# Patient Record
Sex: Female | Born: 1985
Health system: Southern US, Community
[De-identification: ages and names within clinical notes are randomized; demographics above are authoritative.]

## PROBLEM LIST (undated history)

## (undated) DIAGNOSIS — D649 Anemia, unspecified: Secondary | ICD-10-CM

## (undated) DIAGNOSIS — M810 Age-related osteoporosis without current pathological fracture: Secondary | ICD-10-CM

## (undated) DIAGNOSIS — T8484XA Pain due to internal orthopedic prosthetic devices, implants and grafts, initial encounter: Secondary | ICD-10-CM

## (undated) DIAGNOSIS — Z9889 Other specified postprocedural states: Secondary | ICD-10-CM

## (undated) DIAGNOSIS — R112 Nausea with vomiting, unspecified: Secondary | ICD-10-CM

## (undated) DIAGNOSIS — M069 Rheumatoid arthritis, unspecified: Secondary | ICD-10-CM

## (undated) DIAGNOSIS — Z8709 Personal history of other diseases of the respiratory system: Secondary | ICD-10-CM

## (undated) HISTORY — DX: Age-related osteoporosis without current pathological fracture: M81.0

## (undated) HISTORY — PX: WISDOM TOOTH EXTRACTION: SHX21

## (undated) HISTORY — PX: DILATION AND CURETTAGE OF UTERUS: SHX78

## (undated) HISTORY — DX: Rheumatoid arthritis, unspecified: M06.9

---

## 2005-08-03 ENCOUNTER — Encounter: Admission: RE | Admit: 2005-08-03 | Discharge: 2005-08-03 | Payer: Self-pay | Admitting: Emergency Medicine

## 2008-08-05 ENCOUNTER — Other Ambulatory Visit: Admission: RE | Admit: 2008-08-05 | Discharge: 2008-08-05 | Payer: Self-pay | Admitting: Obstetrics and Gynecology

## 2010-07-14 ENCOUNTER — Ambulatory Visit: Payer: Self-pay | Admitting: Internal Medicine

## 2010-07-14 DIAGNOSIS — J45909 Unspecified asthma, uncomplicated: Secondary | ICD-10-CM | POA: Insufficient documentation

## 2010-07-14 DIAGNOSIS — R030 Elevated blood-pressure reading, without diagnosis of hypertension: Secondary | ICD-10-CM | POA: Insufficient documentation

## 2010-07-14 DIAGNOSIS — M069 Rheumatoid arthritis, unspecified: Secondary | ICD-10-CM | POA: Insufficient documentation

## 2010-07-14 DIAGNOSIS — D509 Iron deficiency anemia, unspecified: Secondary | ICD-10-CM | POA: Insufficient documentation

## 2010-07-14 LAB — CONVERTED CEMR LAB
ALT: 14 units/L (ref 0–35)
AST: 17 units/L (ref 0–37)
Albumin: 3.7 g/dL (ref 3.5–5.2)
Alkaline Phosphatase: 79 units/L (ref 39–117)
BUN: 11 mg/dL (ref 6–23)
Basophils Absolute: 0.1 10*3/uL (ref 0.0–0.1)
Basophils Relative: 0.5 % (ref 0.0–3.0)
Bilirubin, Direct: 0 mg/dL (ref 0.0–0.3)
CO2: 28 meq/L (ref 19–32)
Calcium: 9.4 mg/dL (ref 8.4–10.5)
Chloride: 105 meq/L (ref 96–112)
Creatinine, Ser: 0.7 mg/dL (ref 0.4–1.2)
Eosinophils Absolute: 0.1 10*3/uL (ref 0.0–0.7)
Eosinophils Relative: 0.4 % (ref 0.0–5.0)
GFR calc non Af Amer: 140.83 mL/min (ref >60)
Glucose, Bld: 86 mg/dL (ref 70–99)
HCT: 33.5 % — ABNORMAL LOW (ref 36.0–46.0)
Hemoglobin: 11.3 g/dL — ABNORMAL LOW (ref 12.0–15.0)
Hgb A1c MFr Bld: 6.3 % (ref 4.6–6.5)
Lymphocytes Relative: 34.4 % (ref 12.0–46.0)
Lymphs Abs: 4.8 10*3/uL — ABNORMAL HIGH (ref 0.7–4.0)
MCHC: 33.6 g/dL (ref 30.0–36.0)
MCV: 85 fL (ref 78.0–100.0)
Monocytes Absolute: 0.9 10*3/uL (ref 0.1–1.0)
Monocytes Relative: 6.1 % (ref 3.0–12.0)
Neutro Abs: 8.2 10*3/uL — ABNORMAL HIGH (ref 1.4–7.7)
Neutrophils Relative %: 58.6 % (ref 43.0–77.0)
Platelets: 332 10*3/uL (ref 150.0–400.0)
Potassium: 3.5 meq/L (ref 3.5–5.1)
RBC: 3.94 M/uL (ref 3.87–5.11)
RDW: 14 % (ref 11.5–14.6)
Sed Rate: 39 mm/hr — ABNORMAL HIGH (ref 0–22)
Sodium: 139 meq/L (ref 135–145)
TSH: 0.52 microintl units/mL (ref 0.35–5.50)
Total Bilirubin: 0.5 mg/dL (ref 0.3–1.2)
Total Protein: 7.8 g/dL (ref 6.0–8.3)
WBC: 14 10*3/uL — ABNORMAL HIGH (ref 4.5–10.5)

## 2010-07-18 ENCOUNTER — Encounter: Admission: RE | Admit: 2010-07-18 | Discharge: 2010-07-18 | Payer: Self-pay | Admitting: Rheumatology

## 2010-08-05 ENCOUNTER — Emergency Department (HOSPITAL_COMMUNITY): Admission: EM | Admit: 2010-08-05 | Discharge: 2010-08-05 | Payer: Self-pay | Admitting: Emergency Medicine

## 2010-08-25 ENCOUNTER — Encounter: Payer: Self-pay | Admitting: Internal Medicine

## 2010-10-24 NOTE — Assessment & Plan Note (Signed)
Summary: New pt Cpx/UMR/cd   Vital Signs:  Patient profile:   25 year old female Menstrual status:  regular LMP:     07/03/2010 Height:      64 inches Weight:      159 pounds BMI:     27.39 O2 Sat:      95 % on Room air Temp:     98.3 degrees F oral Pulse rate:   85 / minute Pulse rhythm:   regular Resp:     16 per minute BP sitting:   132 / 90  (left arm)  Vitals Entered By: Jarome Lamas (July 14, 2010 11:15 AM)  Nutrition Counseling: Patient's BMI is greater than 25 and therefore counseled on weight management options.  O2 Flow:  Room air CC: labs LMP (date): 07/03/2010     Menstrual Status regular Enter LMP: 07/03/2010   Primary Care Nimah Uphoff:  Etta Grandchild MD  CC:  labs.  History of Present Illness: New to me she needs to have labs done. She has a note from an [place]. Dr. Hyacinth Meeker that asks that her BS be tested due to blurred vision and abrupt refractive changes on her exam. Also, she says that her Rheum, Dr. Valorie Roosevelt needs labs done for RA.  Preventive Screening-Counseling & Management  Alcohol-Tobacco     Alcohol drinks/day: 0     Alcohol Counseling: not indicated; patient does not drink     Smoking Status: never     Tobacco Counseling: not indicated; no tobacco use  Caffeine-Diet-Exercise     Does Patient Exercise: yes  Hep-HIV-STD-Contraception     Hepatitis Risk: no risk noted     HIV Risk: no risk noted     STD Risk: no risk noted      Sexual History:  currently monogamous.        Drug Use:  no.        Blood Transfusions:  no.    Clinical Review Panels:  Immunizations   Last Tetanus Booster:  Historical (06/28/2009)   Current Medications (verified): 1)  None  Allergies (verified): No Known Drug Allergies  Past History:  Past Medical History: Asthma Rheumatoid arthritis Anemia-NOS  Past Surgical History: Denies surgical history  Family History: Family History Diabetes 1st degree relative  Social History: Occupation:  [employer] Single Never Smoked Alcohol use-no Drug use-no Regular exercise-yes Smoking Status:  never Hepatitis Risk:  no risk noted HIV Risk:  no risk noted STD Risk:  no risk noted Sexual History:  currently monogamous Blood Transfusions:  no Drug Use:  no Does Patient Exercise:  yes  Review of Systems  The patient denies anorexia, fever, weight loss, weight gain, chest pain, syncope, dyspnea on exertion, peripheral edema, prolonged cough, headaches, hemoptysis, abdominal pain, suspicious skin lesions, difficulty walking, depression, and angioedema.   Resp:  Denies chest pain with inspiration, cough, coughing up blood, pleuritic, shortness of breath, sputum productive, and wheezing. MS:  Complains of joint pain and stiffness; denies joint redness, joint swelling, loss of strength, mid back pain, muscle aches, muscle weakness, and thoracic pain. Endo:  Denies cold intolerance, excessive hunger, excessive thirst, excessive urination, heat intolerance, polyuria, and weight change.  Physical Exam  General:  alert, well-developed, well-nourished, well-hydrated, appropriate dress, normal appearance, healthy-appearing, and cooperative to examination.   Head:  normocephalic, atraumatic, no abnormalities observed, and no abnormalities palpated.   Eyes:  No corneal or conjunctival inflammation noted. EOMI. Perrla. Funduscopic exam benign, without hemorrhages, exudates or papilledema. Vision grossly  normal. Mouth:  Oral mucosa and oropharynx without lesions or exudates.  Teeth in good repair. Neck:  supple, full ROM, no masses, no thyromegaly, no JVD, normal carotid upstroke, no carotid bruits, no cervical lymphadenopathy, and no neck tenderness.   Lungs:  normal respiratory effort, no intercostal retractions, no accessory muscle use, normal breath sounds, no dullness, no fremitus, no crackles, and no wheezes.   Heart:  normal rate, regular rhythm, no murmur, no gallop, no rub, and no JVD.     Abdomen:  soft, non-tender, normal bowel sounds, no distention, no masses, no guarding, no rigidity, no rebound tenderness, no abdominal hernia, no inguinal hernia, no hepatomegaly, and no splenomegaly.   Msk:  No deformity or scoliosis noted of thoracic or lumbar spine.   Pulses:  R and L carotid,radial,femoral,dorsalis pedis and posterior tibial pulses are full and equal bilaterally Extremities:  No clubbing, cyanosis, edema, or deformity noted with normal full range of motion of all joints.   Neurologic:  No cranial nerve deficits noted. Station and gait are normal. Plantar reflexes are down-going bilaterally. DTRs are symmetrical throughout. Sensory, motor and coordinative functions appear intact. Skin:  turgor normal, color normal, no rashes, no suspicious lesions, no ecchymoses, no petechiae, no purpura, no ulcerations, and no edema.   Cervical Nodes:  no anterior cervical adenopathy and no posterior cervical adenopathy.   Axillary Nodes:  no R axillary adenopathy and no L axillary adenopathy.   Inguinal Nodes:  no R inguinal adenopathy and no L inguinal adenopathy.   Psych:  Cognition and judgment appear intact. Alert and cooperative with normal attention span and concentration. No apparent delusions, illusions, hallucinations   Impression & Recommendations:  Problem # 1:  ELEVATED BLOOD PRESSURE WITHOUT DIAGNOSIS OF HYPERTENSION (ICD-796.2) Assessment New  Orders: Venipuncture (16109) TLB-BMP (Basic Metabolic Panel-BMET) (80048-METABOL) TLB-CBC Platelet - w/Differential (85025-CBCD) TLB-Hepatic/Liver Function Pnl (80076-HEPATIC) TLB-TSH (Thyroid Stimulating Hormone) (84443-TSH) TLB-A1C / Hgb A1C (Glycohemoglobin) (83036-A1C) TLB-Sedimentation Rate (ESR) (85652-ESR)  BP today: 132/90  Instructed in low sodium diet (DASH Handout) and behavior modification.    Problem # 2:  ENCOUNTER FOR LONG-TERM USE OF OTHER MEDICATIONS (ICD-V58.69) Assessment: New  Orders: Venipuncture  (60454) TLB-BMP (Basic Metabolic Panel-BMET) (80048-METABOL) TLB-CBC Platelet - w/Differential (85025-CBCD) TLB-Hepatic/Liver Function Pnl (80076-HEPATIC) TLB-TSH (Thyroid Stimulating Hormone) (84443-TSH) TLB-A1C / Hgb A1C (Glycohemoglobin) (83036-A1C) TLB-Sedimentation Rate (ESR) (85652-ESR)  Problem # 3:  RHEUMATOID ARTHRITIS (ICD-714.0) Assessment: Unchanged  Orders: Venipuncture (09811) TLB-BMP (Basic Metabolic Panel-BMET) (80048-METABOL) TLB-CBC Platelet - w/Differential (85025-CBCD) TLB-Hepatic/Liver Function Pnl (80076-HEPATIC) TLB-TSH (Thyroid Stimulating Hormone) (84443-TSH) TLB-A1C / Hgb A1C (Glycohemoglobin) (83036-A1C) TLB-Sedimentation Rate (ESR) (85652-ESR)  Patient Instructions: 1)  Please schedule a follow-up appointment in 1 month. 2)  It is important that you exercise regularly at least 20 minutes 5 times a week. If you develop chest pain, have severe difficulty breathing, or feel very tired , stop exercising immediately and seek medical attention. 3)  You need to lose weight. Consider a lower calorie diet and regular exercise.  4)  Check your Blood Pressure regularly. If it is above 140/90: you should make an appointment.   Orders Added: 1)  Venipuncture [36415] 2)  TLB-BMP (Basic Metabolic Panel-BMET) [80048-METABOL] 3)  TLB-CBC Platelet - w/Differential [85025-CBCD] 4)  TLB-Hepatic/Liver Function Pnl [80076-HEPATIC] 5)  TLB-TSH (Thyroid Stimulating Hormone) [84443-TSH] 6)  TLB-A1C / Hgb A1C (Glycohemoglobin) [83036-A1C] 7)  TLB-Sedimentation Rate (ESR) [85652-ESR] 8)  New Patient Level III [91478]   Immunization History:  Tetanus/Td Immunization History:    Tetanus/Td:  historical (06/28/2009)  Immunization History:  Tetanus/Td Immunization History:    Tetanus/Td:  Historical (06/28/2009)

## 2010-10-24 NOTE — Letter (Signed)
Summary: Results Follow-up Letter  [facility]  [address] [place], [place] [address]   Phone: [phone]  Fax: [phone]    07/14/2010  [address] [place], [place]  [address]  Dear Ms. Caseres,   The following are the results of your recent test(s):  Test     Result     Blood sugars   slightly elevated, early diabetes CBC       mild anemia, slightly elevated WBC count Liver/kidney   normal Sed rate     slightly elevated   _________________________________________________________  Please call for an appointment soon _________________________________________________________ _________________________________________________________ _________________________________________________________  Sincerely,  Sanda Linger MD  [facility]

## 2010-11-29 LAB — HM PAP SMEAR: HM Pap smear: NORMAL

## 2010-12-28 ENCOUNTER — Telehealth: Payer: Self-pay

## 2010-12-28 NOTE — Telephone Encounter (Signed)
error 

## 2011-01-04 ENCOUNTER — Telehealth: Payer: Self-pay | Admitting: Internal Medicine

## 2011-01-04 MED ORDER — GLUCOSE BLOOD VI STRP: ORAL_STRIP | Status: AC

## 2011-01-04 NOTE — Telephone Encounter (Signed)
Pt requesting glucose testing strips for TRUEresult meter obtained through [facility] program. Pt states that letter was sent by Marylu Lund Hauser/[employer]. Pt informed that letter was not received from [facility] concerning this matter; she will contact & have new letter forwarded for Pt's chart. Rx sent to [facility]. Pt Informed.

## 2011-01-08 ENCOUNTER — Ambulatory Visit (INDEPENDENT_AMBULATORY_CARE_PROVIDER_SITE_OTHER): Payer: 59 | Admitting: Internal Medicine

## 2011-01-08 ENCOUNTER — Encounter: Payer: 59 | Attending: Internal Medicine

## 2011-01-08 ENCOUNTER — Encounter: Payer: Self-pay | Admitting: Internal Medicine

## 2011-01-08 ENCOUNTER — Ambulatory Visit (INDEPENDENT_AMBULATORY_CARE_PROVIDER_SITE_OTHER)
Admission: RE | Admit: 2011-01-08 | Discharge: 2011-01-08 | Disposition: A | Discharge status: Home / Self Care | Payer: 59 | Source: Ambulatory Visit | Attending: Internal Medicine | Admitting: Internal Medicine

## 2011-01-08 ENCOUNTER — Telehealth: Payer: Self-pay | Admitting: Internal Medicine

## 2011-01-08 ENCOUNTER — Ambulatory Visit: Payer: 59 | Attending: Rheumatology | Admitting: Physical Therapy

## 2011-01-08 DIAGNOSIS — M25511 Pain in right shoulder: Secondary | ICD-10-CM | POA: Insufficient documentation

## 2011-01-08 DIAGNOSIS — M069 Rheumatoid arthritis, unspecified: Secondary | ICD-10-CM

## 2011-01-08 DIAGNOSIS — M25519 Pain in unspecified shoulder: Secondary | ICD-10-CM

## 2011-01-08 DIAGNOSIS — M2560 Stiffness of unspecified joint, not elsewhere classified: Secondary | ICD-10-CM | POA: Insufficient documentation

## 2011-01-08 DIAGNOSIS — Z5189 Encounter for other specified aftercare: Secondary | ICD-10-CM | POA: Insufficient documentation

## 2011-01-08 DIAGNOSIS — M2550 Pain in unspecified joint: Secondary | ICD-10-CM | POA: Insufficient documentation

## 2011-01-08 DIAGNOSIS — M6281 Muscle weakness (generalized): Secondary | ICD-10-CM | POA: Insufficient documentation

## 2011-01-08 MED ORDER — METHYLPREDNISOLONE ACETATE 80 MG/ML IJ SUSP
120.0000 mg | Freq: Once | INTRAMUSCULAR | Status: AC
  Administered 2011-01-08: 120 mg via INTRAMUSCULAR

## 2011-01-08 MED ORDER — TRUEPLUS LANCETS 28G MISC: 1.0000 g | Freq: Two times a day (BID) | Status: DC

## 2011-01-08 NOTE — Telephone Encounter (Signed)
Please call patient - normal shoulder xray results. Pain likely due to RA - No medication changes recommended. Keep followup with Deveshwar as ongoing for same -Thanks.

## 2011-01-08 NOTE — Telephone Encounter (Signed)
Called pt no ansew LMOM MD response concerning xray..01/08/11@5 :10pm/LMB

## 2011-01-08 NOTE — Progress Notes (Signed)
  Subjective:    Patient ID: Miranda [name], female    DOB: January 04, 1986, 25 y.o.   MRN: [id number]  HPI complains of right shoulder pain Onset 1 mo ago, progressively worse associated with swelling at top of right shoulder - tender to touch - not present on left shoulder Precipitated by overuse - industrial stapler use at work - but no fall or injury No hx same pain in this area but diffuse chronic jt pain/swelling from uncontrolled RA -  No weakness in right hand, no neck pain Pain worse with overhead or forward motion - better at rest but never resolved   Past Medical History  Diagnosis Date  . ANEMIA-NOS   . Rheumatoid arthritis   . ASTHMA     Review of Systems  Constitutional: Negative for fever, fatigue and unexpected weight change.  Respiratory: Negative for cough.   Musculoskeletal: Negative for myalgias and back pain.  Skin: Negative for rash.  Neurological: Negative for headaches.  Hematological: Negative for adenopathy.       Objective:   Physical Exam  Constitutional: She appears well-developed and well-nourished. No distress.  Cardiovascular: Normal rate, regular rhythm and normal heart sounds.   No murmur heard. Pulmonary/Chest: Effort normal and breath sounds normal. No respiratory distress.  Musculoskeletal:       Bursa swelling over ac right side, limited overhead ROM due to pain but RTC intact; chronic left wrist bursa pannus - no other synovitis of digits at this time   BP 130/72  Pulse 84  Temp(Src) 99.6 F (37.6 C) (Oral)  Ht 5\' 4"  (1.626 m)  Wt 144 lb (65.318 kg)  BMI 24.72 kg/m2  SpO2 99%      Lab Results  Component Value Date   WBC 14.0* 07/14/2010   HGB 11.3* 07/14/2010   HCT 33.5* 07/14/2010   PLT 332.0 07/14/2010   ALT 14 07/14/2010   AST 17 07/14/2010   NA 139 07/14/2010   K 3.5 07/14/2010   CL 105 07/14/2010   CREATININE 0.7 07/14/2010   BUN 11 07/14/2010   CO2 28 07/14/2010   TSH 0.52 07/14/2010   HGBA1C 6.3 07/14/2010      Assessment & Plan:  See problem list. Medications and labs reviewed today.

## 2011-01-08 NOTE — Assessment & Plan Note (Signed)
Hx reviewed - on chronic pred and follows with rheum for same (deveshwar) see shoulder pain above

## 2011-01-08 NOTE — Assessment & Plan Note (Signed)
suspect bursa inflammation from uncontrolled RA tx steroid burst with depo medrol now -  Check xray rule out other underlying bony abnormality - follow up rheum as ongoing to exhaust DMARDs for RA control - no change daily pred

## 2011-01-08 NOTE — Patient Instructions (Signed)
Test(s) ordered today. Your results will be called to you after review (48-72hours after test completion). If any changes need to be made, you will be notified at that time. Shot medrol given for swelling today - no change in prednisone dose follow up dr. Corliss Skains as planned for RA

## 2011-01-11 ENCOUNTER — Ambulatory Visit: Payer: Self-pay | Admitting: Occupational Therapy

## 2011-01-16 ENCOUNTER — Ambulatory Visit: Payer: 59 | Admitting: Physical Therapy

## 2011-01-18 ENCOUNTER — Ambulatory Visit: Payer: 59 | Admitting: Occupational Therapy

## 2011-01-19 ENCOUNTER — Ambulatory Visit: Payer: 59 | Admitting: Physical Therapy

## 2011-01-23 ENCOUNTER — Ambulatory Visit: Payer: 59 | Attending: Rheumatology | Admitting: Physical Therapy

## 2011-01-23 DIAGNOSIS — M2560 Stiffness of unspecified joint, not elsewhere classified: Secondary | ICD-10-CM | POA: Insufficient documentation

## 2011-01-23 DIAGNOSIS — M2550 Pain in unspecified joint: Secondary | ICD-10-CM | POA: Insufficient documentation

## 2011-01-23 DIAGNOSIS — M6281 Muscle weakness (generalized): Secondary | ICD-10-CM | POA: Insufficient documentation

## 2011-01-23 DIAGNOSIS — Z5189 Encounter for other specified aftercare: Secondary | ICD-10-CM | POA: Insufficient documentation

## 2011-01-26 ENCOUNTER — Ambulatory Visit: Payer: 59 | Admitting: Physical Therapy

## 2011-01-30 ENCOUNTER — Ambulatory Visit: Payer: 59 | Admitting: Occupational Therapy

## 2011-01-30 ENCOUNTER — Ambulatory Visit: Payer: 59 | Admitting: Physical Therapy

## 2011-02-02 ENCOUNTER — Ambulatory Visit: Payer: 59 | Admitting: Physical Therapy

## 2011-02-02 ENCOUNTER — Encounter: Payer: Self-pay | Admitting: Occupational Therapy

## 2011-02-05 ENCOUNTER — Ambulatory Visit: Payer: 59 | Admitting: Occupational Therapy

## 2011-02-06 ENCOUNTER — Encounter: Payer: Self-pay | Admitting: Physical Therapy

## 2011-02-08 ENCOUNTER — Ambulatory Visit: Payer: 59 | Admitting: Occupational Therapy

## 2011-02-09 ENCOUNTER — Encounter: Payer: Self-pay | Admitting: Physical Therapy

## 2011-02-09 ENCOUNTER — Ambulatory Visit: Payer: 59 | Admitting: Physical Therapy

## 2011-02-12 ENCOUNTER — Ambulatory Visit: Payer: 59 | Admitting: Occupational Therapy

## 2011-02-13 ENCOUNTER — Ambulatory Visit: Payer: 59 | Admitting: Physical Therapy

## 2011-02-14 ENCOUNTER — Ambulatory Visit: Payer: 59 | Admitting: Occupational Therapy

## 2011-02-15 ENCOUNTER — Ambulatory Visit: Payer: 59 | Admitting: Physical Therapy

## 2011-02-20 ENCOUNTER — Ambulatory Visit: Payer: 59 | Admitting: Occupational Therapy

## 2011-02-21 ENCOUNTER — Ambulatory Visit: Payer: 59 | Admitting: Physical Therapy

## 2011-02-22 ENCOUNTER — Ambulatory Visit: Payer: 59 | Admitting: Occupational Therapy

## 2011-02-27 ENCOUNTER — Ambulatory Visit: Payer: 59 | Attending: Rheumatology | Admitting: Physical Therapy

## 2011-02-27 ENCOUNTER — Ambulatory Visit: Payer: 59 | Admitting: Occupational Therapy

## 2011-02-27 DIAGNOSIS — M2550 Pain in unspecified joint: Secondary | ICD-10-CM | POA: Insufficient documentation

## 2011-02-27 DIAGNOSIS — Z5189 Encounter for other specified aftercare: Secondary | ICD-10-CM | POA: Insufficient documentation

## 2011-02-27 DIAGNOSIS — M2560 Stiffness of unspecified joint, not elsewhere classified: Secondary | ICD-10-CM | POA: Insufficient documentation

## 2011-02-27 DIAGNOSIS — M6281 Muscle weakness (generalized): Secondary | ICD-10-CM | POA: Insufficient documentation

## 2011-02-28 ENCOUNTER — Ambulatory Visit: Payer: 59 | Admitting: Physical Therapy

## 2011-03-01 ENCOUNTER — Encounter: Payer: Self-pay | Admitting: Occupational Therapy

## 2011-03-08 ENCOUNTER — Encounter: Payer: 59 | Attending: Internal Medicine

## 2011-03-13 ENCOUNTER — Ambulatory Visit: Payer: 59 | Admitting: Physical Therapy

## 2011-03-16 ENCOUNTER — Ambulatory Visit: Payer: 59 | Admitting: Physical Therapy

## 2011-03-19 ENCOUNTER — Ambulatory Visit: Payer: 59

## 2011-03-21 ENCOUNTER — Ambulatory Visit: Payer: 59 | Admitting: Physical Therapy

## 2011-03-23 ENCOUNTER — Ambulatory Visit: Payer: 59 | Admitting: Physical Therapy

## 2011-07-13 ENCOUNTER — Ambulatory Visit: Payer: Self-pay | Admitting: Internal Medicine

## 2011-07-27 ENCOUNTER — Other Ambulatory Visit (INDEPENDENT_AMBULATORY_CARE_PROVIDER_SITE_OTHER): Payer: 59

## 2011-07-27 ENCOUNTER — Ambulatory Visit (INDEPENDENT_AMBULATORY_CARE_PROVIDER_SITE_OTHER): Payer: 59 | Admitting: Internal Medicine

## 2011-07-27 ENCOUNTER — Encounter: Payer: Self-pay | Admitting: Internal Medicine

## 2011-07-27 VITALS — BP 132/86 | HR 90 | Temp 98.8°F | Resp 16 | Wt 138.5 lb

## 2011-07-27 DIAGNOSIS — R739 Hyperglycemia, unspecified: Secondary | ICD-10-CM | POA: Insufficient documentation

## 2011-07-27 DIAGNOSIS — R7309 Other abnormal glucose: Secondary | ICD-10-CM

## 2011-07-27 DIAGNOSIS — D649 Anemia, unspecified: Secondary | ICD-10-CM

## 2011-07-27 DIAGNOSIS — R030 Elevated blood-pressure reading, without diagnosis of hypertension: Secondary | ICD-10-CM

## 2011-07-27 LAB — CBC WITH DIFFERENTIAL/PLATELET
Basophils Absolute: 0 10*3/uL (ref 0.0–0.1)
Eosinophils Absolute: 0 10*3/uL (ref 0.0–0.7)
Eosinophils Relative: 0 % (ref 0.0–5.0)
Lymphs Abs: 1.6 10*3/uL (ref 0.7–4.0)
MCHC: 32.5 g/dL (ref 30.0–36.0)
MCV: 85.5 fl (ref 78.0–100.0)
Monocytes Absolute: 0.1 10*3/uL (ref 0.1–1.0)
Neutrophils Relative %: 78.7 % — ABNORMAL HIGH (ref 43.0–77.0)
Platelets: 408 10*3/uL — ABNORMAL HIGH (ref 150.0–400.0)
RDW: 15 % — ABNORMAL HIGH (ref 11.5–14.6)
WBC: 8.3 10*3/uL (ref 4.5–10.5)

## 2011-07-27 LAB — URINALYSIS, ROUTINE W REFLEX MICROSCOPIC
Bilirubin Urine: NEGATIVE
Ketones, ur: NEGATIVE
Nitrite: NEGATIVE
Specific Gravity, Urine: 1.015 (ref 1.000–1.030)
Total Protein, Urine: NEGATIVE
Urine Glucose: NEGATIVE
Urobilinogen, UA: 0.2 (ref 0.0–1.0)
pH: 7 (ref 5.0–8.0)

## 2011-07-27 LAB — LIPID PANEL
HDL: 60.5 mg/dL (ref >39.00)
LDL Cholesterol: 127 mg/dL — ABNORMAL HIGH (ref 0–99)
Total CHOL/HDL Ratio: 3
Triglycerides: 26 mg/dL (ref 0.0–149.0)
VLDL: 5.2 mg/dL (ref 0.0–40.0)

## 2011-07-27 LAB — COMPREHENSIVE METABOLIC PANEL
ALT: 13 U/L (ref 0–35)
AST: 17 U/L (ref 0–37)
Albumin: 4.1 g/dL (ref 3.5–5.2)
Alkaline Phosphatase: 80 U/L (ref 39–117)
BUN: 12 mg/dL (ref 6–23)
Calcium: 9.6 mg/dL (ref 8.4–10.5)
Creatinine, Ser: 0.7 mg/dL (ref 0.4–1.2)
Glucose, Bld: 127 mg/dL — ABNORMAL HIGH (ref 70–99)
Potassium: 4.1 mEq/L (ref 3.5–5.1)
Sodium: 136 mEq/L (ref 135–145)
Total Bilirubin: 0.5 mg/dL (ref 0.3–1.2)
Total Protein: 8.5 g/dL — ABNORMAL HIGH (ref 6.0–8.3)

## 2011-07-27 LAB — IBC PANEL
Iron: 28 ug/dL — ABNORMAL LOW (ref 42–145)
Saturation Ratios: 6.5 % — ABNORMAL LOW (ref 20.0–50.0)
Transferrin: 306.4 mg/dL (ref 212.0–360.0)

## 2011-07-27 LAB — TSH: TSH: 0.3 u[IU]/mL — ABNORMAL LOW (ref 0.35–5.50)

## 2011-07-27 LAB — FERRITIN: Ferritin: 34.9 ng/mL (ref 10.0–291.0)

## 2011-07-27 LAB — VITAMIN B12: Vitamin B-12: 588 pg/mL (ref 211–911)

## 2011-07-27 NOTE — Assessment & Plan Note (Signed)
I will recheck her a1c and see if she has developed DM that requires medical therapy

## 2011-07-27 NOTE — Progress Notes (Signed)
Subjective:    Patient ID: Miranda [name], female    DOB: 06-03-1986, 25 y.o.   MRN: [id number]  Anemia Presents for follow-up visit. Symptoms include malaise/fatigue. There has been no abdominal pain, anorexia, bruising/bleeding easily, confusion, fever, leg swelling, light-headedness, pallor, palpitations, paresthesias, pica or weight loss. Signs of blood loss that are present include menorrhagia. Signs of blood loss that are not present include hematemesis, hematochezia and melena. Compliance problems include psychosocial issues.  Compliance with medications is 0-25%.      Review of Systems  Constitutional: Positive for malaise/fatigue. Negative for fever, chills, weight loss, diaphoresis, activity change, appetite change, fatigue and unexpected weight change.  HENT: Negative.   Eyes: Negative.   Respiratory: Negative for apnea, cough, choking, chest tightness, shortness of breath, wheezing and stridor.   Cardiovascular: Negative for chest pain, palpitations and leg swelling.  Gastrointestinal: Negative for nausea, vomiting, abdominal pain, diarrhea, constipation, blood in stool, melena, hematochezia, abdominal distention, anal bleeding, anorexia and hematemesis.  Genitourinary: Positive for menorrhagia. Negative for dysuria, urgency, frequency, hematuria, vaginal discharge, enuresis, difficulty urinating, vaginal pain, menstrual problem, pelvic pain and dyspareunia.  Musculoskeletal: Negative for myalgias, back pain, joint swelling, arthralgias and gait problem.  Skin: Negative for color change, pallor, rash and wound.  Neurological: Negative for dizziness, tremors, seizures, syncope, facial asymmetry, speech difficulty, weakness, light-headedness, numbness, headaches and paresthesias.  Hematological: Negative for adenopathy. Does not bruise/bleed easily.  Psychiatric/Behavioral: Negative.  Negative for confusion.       Objective:   Physical Exam  Vitals reviewed. Constitutional:  She is oriented to person, place, and time. She appears well-developed and well-nourished. No distress.  HENT:  Head: Normocephalic and atraumatic. No trismus in the jaw.  Right Ear: Hearing, tympanic membrane, external ear and ear canal normal.  Left Ear: Hearing, tympanic membrane, external ear and ear canal normal.  Mouth/Throat: Oropharynx is clear and moist. Mucous membranes are pale, not dry and not cyanotic. No uvula swelling. No oropharyngeal exudate, posterior oropharyngeal edema, posterior oropharyngeal erythema or tonsillar abscesses.  Eyes: Conjunctivae are normal. Right eye exhibits no discharge. Left eye exhibits no discharge. No scleral icterus.  Neck: Normal range of motion. Neck supple. No JVD present. No tracheal deviation present. No thyromegaly present.  Cardiovascular: Normal rate, regular rhythm, normal heart sounds and intact distal pulses.  Exam reveals no gallop and no friction rub.   No murmur heard. Pulmonary/Chest: Effort normal and breath sounds normal. No stridor. No respiratory distress. She has no wheezes. She has no rales. She exhibits no tenderness.  Abdominal: Soft. Bowel sounds are normal. She exhibits no distension and no mass. There is no tenderness. There is no rebound and no guarding.  Musculoskeletal: Normal range of motion. She exhibits no edema and no tenderness.  Lymphadenopathy:    She has no cervical adenopathy.  Neurological: She is oriented to person, place, and time. She displays normal reflexes. She exhibits normal muscle tone.  Skin: Skin is warm and dry. No rash noted. She is not diaphoretic. No erythema. No pallor.  Psychiatric: She has a normal mood and affect. Her behavior is normal. Judgment and thought content normal.     Lab Results  Component Value Date   WBC 14.0* 07/14/2010   HGB 11.3* 07/14/2010   HCT 33.5* 07/14/2010   PLT 332.0 07/14/2010   GLUCOSE 86 07/14/2010   ALT 14 07/14/2010   AST 17 07/14/2010   NA 139 07/14/2010    K 3.5 07/14/2010   CL 105 07/14/2010  CREATININE 0.7 07/14/2010   BUN 11 07/14/2010   CO2 28 07/14/2010   TSH 0.52 07/14/2010   HGBA1C 6.3 07/14/2010       Assessment & Plan:

## 2011-07-27 NOTE — Assessment & Plan Note (Signed)
She remains borderline elevated but no meds are required at this time

## 2011-07-27 NOTE — Patient Instructions (Signed)
Anemia, Nonspecific Your exam and blood tests show you are anemic. This means your blood (hemoglobin) level is low. Normal hemoglobin values are 12 to 15 g/dL for females and 14 to 17 g/dL for males. Make a note of your hemoglobin level today. The hematocrit percent is also used to measure anemia. A normal hematocrit is 38% to 46% in females and 42% to 49% in males. Make a note of your hematocrit level today. CAUSES  Anemia can be due to many different causes.  Excessive bleeding from periods (in women).   Intestinal bleeding.   Poor nutrition.   Kidney, thyroid, liver, and bone marrow diseases.  SYMPTOMS  Anemia can come on suddenly (acute). It can also come on slowly. Symptoms can include:  Minor weakness.   Dizziness.   Palpitations.   Shortness of breath.  Symptoms may be absent until half your hemoglobin is missing if it comes on slowly. Anemia due to acute blood loss from an injury or internal bleeding may require blood transfusion if the loss is severe. Hospital care is needed if you are anemic and there is significant continual blood loss. TREATMENT   Stool tests for blood (Hemoccult) and additional lab tests are often needed. This determines the best treatment.   Further checking on your condition and your response to treatment is very important. It often takes many weeks to correct anemia.  Depending on the cause, treatment can include:  Supplements of iron.   Vitamins B12 and folic acid.   Hormone medicines.If your anemia is due to bleeding, finding the cause of the blood loss is very important. This will help avoid further problems.  SEEK IMMEDIATE MEDICAL CARE IF:   You develop fainting, extreme weakness, shortness of breath, or chest pain.   You develop heavy vaginal bleeding.   You develop bloody or black, tarry stools or vomit up blood.   You develop a high fever, rash, repeated vomiting, or dehydration.  Document Released: 10/18/2004 Document Revised:  05/23/2011 Document Reviewed: 07/26/2009 Exit[employer] Patient Information 2012 [employer]. 

## 2011-07-27 NOTE — Assessment & Plan Note (Signed)
I will check her CBC and will test her vitamin levels as well

## 2011-07-29 ENCOUNTER — Encounter: Payer: Self-pay | Admitting: Internal Medicine

## 2011-08-28 ENCOUNTER — Encounter: Payer: 59 | Attending: Internal Medicine | Admitting: *Deleted

## 2011-08-28 ENCOUNTER — Encounter: Payer: Self-pay | Admitting: *Deleted

## 2011-08-28 DIAGNOSIS — D649 Anemia, unspecified: Secondary | ICD-10-CM | POA: Insufficient documentation

## 2011-08-28 DIAGNOSIS — Z713 Dietary counseling and surveillance: Secondary | ICD-10-CM | POA: Insufficient documentation

## 2011-08-28 DIAGNOSIS — T380X5A Adverse effect of glucocorticoids and synthetic analogues, initial encounter: Secondary | ICD-10-CM | POA: Insufficient documentation

## 2011-08-28 DIAGNOSIS — E139 Other specified diabetes mellitus without complications: Secondary | ICD-10-CM | POA: Insufficient documentation

## 2011-08-28 DIAGNOSIS — R739 Hyperglycemia, unspecified: Secondary | ICD-10-CM

## 2011-08-28 DIAGNOSIS — R630 Anorexia: Secondary | ICD-10-CM | POA: Insufficient documentation

## 2011-08-28 NOTE — Patient Instructions (Signed)
Patient agrees to:  Increase food choices higher in iron including red meat,  baked beans and baked potato choices in place of rice for iron and vitamin C to increase absorption of iron in other foods Try Total and other enriched cereals that are high in iron Include vanilla Boost for increased iron and calories for weight management when too tired to cook  Consider cooking with a small iron skillet as additional source of iron in foods

## 2011-08-28 NOTE — Progress Notes (Signed)
  Medical Nutrition Therapy:  Appt start time: 0900 end time:  1000.   Assessment:  Primary concerns today: Patient states she is concerned about anemia which she states she has had since she was in college. She also states she can gain or lose 10 pounds quickly and would be more comfortable weighing about 10 more pounds @ 150. She has had some elevated BGs, but that is not her main concern today.   MEDICATIONS: see list   DIETARY INTAKE:  Usual eating pattern includes 3 meals and 2-3 snacks per day.  Everyday foods include limited variety of most food groups. She states she is a picky eater.  Avoided foods include milk, chocolate and ice cream, many vegetables and fruits.    24-hr recall:  B ( AM): Breakfast bowl from [employer]  Snk ( AM): occasionally vanilla wafers  L ( PM): grilled chicken and potato chips Snk ( PM): almonds D ( PM): chicken, mashed potatoes in microwave meals Snk ( PM): almonds Beverages: water  Usual physical activity: [employer] class 1x/week, stationary bike in fitness room 2 x/week  Estimated energy needs: 2000 calories 225 g carbohydrates 150 g protein 56 g fat  Progress Towards Goal(s):  In progress.   Nutritional Diagnosis:  NI-5.11.1 Predicted suboptimal nutrient intake As related to adequate iron and calorie intake.  As evidenced by anemia and desire to gain 10 pounds.    Intervention:  Nutrition counseling provided as to iron content of various foods compared to her current food choices, suggestion of using Boost supplement to increase calories appropriately and provide 25% iron per day, and possiblity of using an iron skillet for food preparation.. Patient agrees to: Increase food choices higher in iron including red meat,  baked beans and baked potato choices in place of rice for iron and vitamin C to increase absorption of iron in other foods Try Total and other enriched cereals that are high in iron Include vanilla Boost for increased iron and  calories for weight management when too tired to cook  Consider cooking with a small iron skillet as additional source of iron in foods Handouts given during visit include:  List of iron rich foods from [employer]  [employer] and Label Reading handouts  Monitoring/Evaluation:  Dietary intake, exercise, Label reading, and body weight in 4 week(s).

## 2011-10-19 ENCOUNTER — Ambulatory Visit (INDEPENDENT_AMBULATORY_CARE_PROVIDER_SITE_OTHER): Payer: 59

## 2011-10-19 DIAGNOSIS — Z23 Encounter for immunization: Secondary | ICD-10-CM

## 2011-10-23 ENCOUNTER — Other Ambulatory Visit (INDEPENDENT_AMBULATORY_CARE_PROVIDER_SITE_OTHER): Payer: 59

## 2011-10-23 ENCOUNTER — Encounter: Payer: Self-pay | Admitting: Internal Medicine

## 2011-10-23 ENCOUNTER — Ambulatory Visit (INDEPENDENT_AMBULATORY_CARE_PROVIDER_SITE_OTHER): Payer: 59 | Admitting: Internal Medicine

## 2011-10-23 VITALS — BP 120/80 | HR 104 | Temp 98.1°F | Resp 16 | Ht 64.0 in | Wt 135.0 lb

## 2011-10-23 DIAGNOSIS — R7309 Other abnormal glucose: Secondary | ICD-10-CM

## 2011-10-23 DIAGNOSIS — R739 Hyperglycemia, unspecified: Secondary | ICD-10-CM

## 2011-10-23 DIAGNOSIS — D649 Anemia, unspecified: Secondary | ICD-10-CM

## 2011-10-23 DIAGNOSIS — T380X5A Adverse effect of glucocorticoids and synthetic analogues, initial encounter: Secondary | ICD-10-CM

## 2011-10-23 DIAGNOSIS — M069 Rheumatoid arthritis, unspecified: Secondary | ICD-10-CM

## 2011-10-23 DIAGNOSIS — M8580 Other specified disorders of bone density and structure, unspecified site: Secondary | ICD-10-CM

## 2011-10-23 DIAGNOSIS — M839 Adult osteomalacia, unspecified: Secondary | ICD-10-CM

## 2011-10-23 DIAGNOSIS — M899 Disorder of bone, unspecified: Secondary | ICD-10-CM

## 2011-10-23 DIAGNOSIS — Z92241 Personal history of systemic steroid therapy: Secondary | ICD-10-CM

## 2011-10-23 LAB — VITAMIN B12: Vitamin B-12: 412 pg/mL (ref 211–911)

## 2011-10-23 LAB — URINALYSIS, ROUTINE W REFLEX MICROSCOPIC
Bilirubin Urine: NEGATIVE
Leukocytes, UA: NEGATIVE
Nitrite: NEGATIVE
pH: 7 (ref 5.0–8.0)

## 2011-10-23 LAB — IBC PANEL
Iron: 13 ug/dL — ABNORMAL LOW (ref 42–145)
Saturation Ratios: 3.6 % — ABNORMAL LOW (ref 20.0–50.0)
Transferrin: 257.8 mg/dL (ref 212.0–360.0)

## 2011-10-23 LAB — COMPREHENSIVE METABOLIC PANEL
BUN: 13 mg/dL (ref 6–23)
CO2: 27 mEq/L (ref 19–32)
Calcium: 9 mg/dL (ref 8.4–10.5)
Chloride: 103 mEq/L (ref 96–112)
Creatinine, Ser: 0.6 mg/dL (ref 0.4–1.2)
GFR: 150.12 mL/min (ref >60.00)
Glucose, Bld: 100 mg/dL — ABNORMAL HIGH (ref 70–99)

## 2011-10-23 LAB — CBC WITH DIFFERENTIAL/PLATELET
Basophils Absolute: 0 10*3/uL (ref 0.0–0.1)
Eosinophils Absolute: 0 10*3/uL (ref 0.0–0.7)
HCT: 27.9 % — ABNORMAL LOW (ref 36.0–46.0)
Hemoglobin: 9 g/dL — ABNORMAL LOW (ref 12.0–15.0)
Lymphocytes Relative: 32.4 % (ref 12.0–46.0)
Lymphs Abs: 2 10*3/uL (ref 0.7–4.0)
MCHC: 32.2 g/dL (ref 30.0–36.0)
Neutro Abs: 3.6 10*3/uL (ref 1.4–7.7)
RDW: 15.3 % — ABNORMAL HIGH (ref 11.5–14.6)

## 2011-10-23 LAB — FERRITIN: Ferritin: 70.7 ng/mL (ref 10.0–291.0)

## 2011-10-23 NOTE — Assessment & Plan Note (Signed)
ESR and CRP today to look for inflammatory process

## 2011-10-23 NOTE — Assessment & Plan Note (Signed)
I will check her CBC and will look at her vitamin levels as well 

## 2011-10-23 NOTE — Progress Notes (Signed)
Subjective:    Patient ID: Miranda [name], female    DOB: 01/25/1986, 26 y.o.   MRN: [id number]  Anemia Presents for follow-up visit. Symptoms include light-headedness and malaise/fatigue. There has been no anorexia, bruising/bleeding easily, confusion, fever, leg swelling, pallor, palpitations, paresthesias, pica or weight loss. Signs of blood loss that are present include menorrhagia. Signs of blood loss that are not present include hematemesis, hematochezia, melena and vaginal bleeding. Compliance problems include psychosocial issues.       Review of Systems  Constitutional: Positive for malaise/fatigue and fatigue. Negative for fever, chills, weight loss, diaphoresis, activity change, appetite change and unexpected weight change.  HENT: Negative.   Eyes: Negative.   Respiratory: Negative for cough, choking, chest tightness, shortness of breath, wheezing and stridor.   Cardiovascular: Negative for chest pain, palpitations and leg swelling.  Gastrointestinal: Negative for nausea, vomiting, diarrhea, constipation, blood in stool, melena, hematochezia, anal bleeding, anorexia and hematemesis.  Genitourinary: Positive for menorrhagia. Negative for dysuria, urgency, frequency, hematuria, flank pain, decreased urine volume, vaginal bleeding, enuresis, difficulty urinating, genital sores, pelvic pain and dyspareunia.  Musculoskeletal: Positive for arthralgias. Negative for myalgias, back pain, joint swelling and gait problem.  Skin: Negative for color change, pallor, rash and wound.  Neurological: Positive for dizziness and light-headedness. Negative for tremors, seizures, syncope, facial asymmetry, speech difficulty, weakness, numbness, headaches and paresthesias.  Hematological: Negative for adenopathy. Does not bruise/bleed easily.  Psychiatric/Behavioral: Negative.  Negative for confusion.       Objective:   Physical Exam  Vitals reviewed. Constitutional: She is oriented to person,  place, and time. She appears well-developed and well-nourished. No distress.  HENT:  Head: Normocephalic and atraumatic.  Mouth/Throat: Oropharynx is clear and moist. Mucous membranes are pale, not dry and not cyanotic. No oropharyngeal exudate, posterior oropharyngeal edema, posterior oropharyngeal erythema or tonsillar abscesses.  Eyes: Conjunctivae are normal. Right eye exhibits no discharge. Left eye exhibits no discharge. No scleral icterus.  Neck: Normal range of motion. Neck supple. No JVD present. No tracheal deviation present. No thyromegaly present.  Cardiovascular: Normal rate, regular rhythm, normal heart sounds and intact distal pulses.  Exam reveals no gallop and no friction rub.   No murmur heard. Pulmonary/Chest: Breath sounds normal. No stridor. No respiratory distress. She has no wheezes. She has no rales. She exhibits no tenderness.  Abdominal: Soft. Bowel sounds are normal. She exhibits no distension and no mass. There is no tenderness. There is no rebound and no guarding.  Musculoskeletal: Normal range of motion. She exhibits no edema and no tenderness.  Lymphadenopathy:    She has no cervical adenopathy.  Neurological: She is oriented to person, place, and time.  Skin: Skin is warm and dry. No rash noted. She is not diaphoretic. No erythema. No pallor.  Psychiatric: She has a normal mood and affect. Her behavior is normal. Judgment and thought content normal.     Lab Results  Component Value Date   WBC 8.3 07/27/2011   HGB 11.1* 07/27/2011   HCT 34.2* 07/27/2011   PLT 408.0* 07/27/2011   GLUCOSE 127* 07/27/2011   CHOL 193 07/27/2011   TRIG 26.0 07/27/2011   HDL 60.50 07/27/2011   LDLCALC 127* 07/27/2011   ALT 13 07/27/2011   AST 17 07/27/2011   NA 136 07/27/2011   K 4.1 07/27/2011   CL 101 07/27/2011   CREATININE 0.7 07/27/2011   BUN 12 07/27/2011   CO2 26 07/27/2011   TSH 0.30* 07/27/2011   HGBA1C 5.9 07/27/2011  Assessment & Plan:

## 2011-10-23 NOTE — Assessment & Plan Note (Signed)
She stopped prednisone one week ago and she fears that she has adrenal insufficiency so I will check her lytes and her cortisol level today

## 2011-10-23 NOTE — Patient Instructions (Signed)
Anemia, Nonspecific Your exam and blood tests show you are anemic. This means your blood (hemoglobin) level is low. Normal hemoglobin values are 12 to 15 g/dL for females and 14 to 17 g/dL for males. Make a note of your hemoglobin level today. The hematocrit percent is also used to measure anemia. A normal hematocrit is 38% to 46% in females and 42% to 49% in males. Make a note of your hematocrit level today. CAUSES  Anemia can be due to many different causes.  Excessive bleeding from periods (in women).   Intestinal bleeding.   Poor nutrition.   Kidney, thyroid, liver, and bone marrow diseases.  SYMPTOMS  Anemia can come on suddenly (acute). It can also come on slowly. Symptoms can include:  Minor weakness.   Dizziness.   Palpitations.   Shortness of breath.  Symptoms may be absent until half your hemoglobin is missing if it comes on slowly. Anemia due to acute blood loss from an injury or internal bleeding may require blood transfusion if the loss is severe. Hospital care is needed if you are anemic and there is significant continual blood loss. TREATMENT   Stool tests for blood (Hemoccult) and additional lab tests are often needed. This determines the best treatment.   Further checking on your condition and your response to treatment is very important. It often takes many weeks to correct anemia.  Depending on the cause, treatment can include:  Supplements of iron.   Vitamins B12 and folic acid.   Hormone medicines.If your anemia is due to bleeding, finding the cause of the blood loss is very important. This will help avoid further problems.  SEEK IMMEDIATE MEDICAL CARE IF:   You develop fainting, extreme weakness, shortness of breath, or chest pain.   You develop heavy vaginal bleeding.   You develop bloody or black, tarry stools or vomit up blood.   You develop a high fever, rash, repeated vomiting, or dehydration.  Document Released: 10/18/2004 Document Revised:  05/23/2011 Document Reviewed: 07/26/2009 Exit[employer] Patient Information 2012 [employer]. 

## 2011-10-23 NOTE — Assessment & Plan Note (Signed)
I will check her Vit D level today 

## 2011-10-23 NOTE — Assessment & Plan Note (Signed)
Vit D level today 

## 2011-10-23 NOTE — Assessment & Plan Note (Signed)
I will check her a1c today to see if she has developed DM II 

## 2011-11-30 ENCOUNTER — Emergency Department (HOSPITAL_COMMUNITY)
Admission: EM | Admit: 2011-11-30 | Discharge: 2011-11-30 | Disposition: A | Discharge status: Home / Self Care | Payer: 59 | Attending: Emergency Medicine | Admitting: Emergency Medicine

## 2011-11-30 ENCOUNTER — Other Ambulatory Visit: Payer: Self-pay

## 2011-11-30 ENCOUNTER — Encounter: Payer: Self-pay | Admitting: Internal Medicine

## 2011-11-30 ENCOUNTER — Other Ambulatory Visit (INDEPENDENT_AMBULATORY_CARE_PROVIDER_SITE_OTHER): Payer: 59

## 2011-11-30 ENCOUNTER — Ambulatory Visit (INDEPENDENT_AMBULATORY_CARE_PROVIDER_SITE_OTHER)
Admission: RE | Admit: 2011-11-30 | Discharge: 2011-11-30 | Disposition: A | Discharge status: Home / Self Care | Payer: 59 | Source: Ambulatory Visit | Attending: Internal Medicine | Admitting: Internal Medicine

## 2011-11-30 ENCOUNTER — Encounter (HOSPITAL_COMMUNITY): Payer: Self-pay

## 2011-11-30 ENCOUNTER — Ambulatory Visit (INDEPENDENT_AMBULATORY_CARE_PROVIDER_SITE_OTHER): Payer: 59 | Admitting: Internal Medicine

## 2011-11-30 ENCOUNTER — Emergency Department (HOSPITAL_COMMUNITY): Payer: 59

## 2011-11-30 DIAGNOSIS — R079 Chest pain, unspecified: Secondary | ICD-10-CM

## 2011-11-30 DIAGNOSIS — R791 Abnormal coagulation profile: Secondary | ICD-10-CM | POA: Insufficient documentation

## 2011-11-30 DIAGNOSIS — M069 Rheumatoid arthritis, unspecified: Secondary | ICD-10-CM | POA: Insufficient documentation

## 2011-11-30 DIAGNOSIS — R000 Tachycardia, unspecified: Secondary | ICD-10-CM

## 2011-11-30 DIAGNOSIS — D649 Anemia, unspecified: Secondary | ICD-10-CM

## 2011-11-30 DIAGNOSIS — R0602 Shortness of breath: Secondary | ICD-10-CM | POA: Insufficient documentation

## 2011-11-30 DIAGNOSIS — R0789 Other chest pain: Secondary | ICD-10-CM

## 2011-11-30 DIAGNOSIS — R071 Chest pain on breathing: Secondary | ICD-10-CM | POA: Insufficient documentation

## 2011-11-30 LAB — CBC WITH DIFFERENTIAL/PLATELET
Basophils Relative: 0.4 % (ref 0.0–3.0)
Eosinophils Relative: 1.2 % (ref 0.0–5.0)
HCT: 28.1 % — ABNORMAL LOW (ref 36.0–46.0)
Hemoglobin: 8.9 g/dL — ABNORMAL LOW (ref 12.0–15.0)
Lymphs Abs: 3 10*3/uL (ref 0.7–4.0)
MCV: 76.4 fl — ABNORMAL LOW (ref 78.0–100.0)
Monocytes Absolute: 0.7 10*3/uL (ref 0.1–1.0)
Monocytes Relative: 7.4 % (ref 3.0–12.0)
Neutro Abs: 6 10*3/uL (ref 1.4–7.7)
RBC: 3.68 Mil/uL — ABNORMAL LOW (ref 3.87–5.11)
WBC: 9.9 10*3/uL (ref 4.5–10.5)

## 2011-11-30 LAB — POCT I-STAT, CHEM 8
BUN: 16 mg/dL (ref 6–23)
Creatinine, Ser: 0.5 mg/dL (ref 0.50–1.10)
Hemoglobin: 9.5 g/dL — ABNORMAL LOW (ref 12.0–15.0)
Potassium: 3.6 mEq/L (ref 3.5–5.1)
Sodium: 140 mEq/L (ref 135–145)
TCO2: 25 mmol/L (ref 0–100)

## 2011-11-30 LAB — VITAMIN B12: Vitamin B-12: 480 pg/mL (ref 211–911)

## 2011-11-30 LAB — IBC PANEL
Iron: 22 ug/dL — ABNORMAL LOW (ref 42–145)
Saturation Ratios: 6 % — ABNORMAL LOW (ref 20.0–50.0)
Transferrin: 264 mg/dL (ref 212.0–360.0)

## 2011-11-30 LAB — FERRITIN: Ferritin: 79 ng/mL (ref 10.0–291.0)

## 2011-11-30 MED ORDER — ORPHENADRINE CITRATE ER 100 MG PO TB12: 100.0000 mg | ORAL_TABLET | Freq: Two times a day (BID) | ORAL | Status: AC

## 2011-11-30 MED ORDER — IOHEXOL 300 MG/ML  SOLN
100.0000 mL | Freq: Once | INTRAMUSCULAR | Status: AC | PRN
  Administered 2011-11-30: 100 mL via INTRAVENOUS

## 2011-11-30 MED ORDER — OXYCODONE-ACETAMINOPHEN 7.5-325 MG PO TABS: 1.0000 | ORAL_TABLET | ORAL | Status: AC | PRN

## 2011-11-30 NOTE — Discharge Instructions (Signed)
Chest Wall Pain Chest wall pain is pain in or around the bones and muscles of your chest. It may take up to 6 weeks to get better. It may take longer if you must stay physically active in your work and activities.  CAUSES  Chest wall pain may happen on its own. However, it may be caused by:  A viral illness like the flu.   Injury.   Coughing.   Exercise.   Arthritis.   Fibromyalgia.   Shingles.  HOME CARE INSTRUCTIONS   Avoid overtiring physical activity. Try not to strain or perform activities that cause pain. This includes any activities using your chest or your abdominal and side muscles, especially if heavy weights are used.   Put ice on the sore area.   Put ice in a plastic bag.   Place a towel between your skin and the bag.   Leave the ice on for 15 to 20 minutes per hour while awake for the first 2 days.   Only take over-the-counter or prescription medicines for pain, discomfort, or fever as directed by your caregiver.  SEEK IMMEDIATE MEDICAL CARE IF:   Your pain increases, or you are very uncomfortable.   You have a fever.   Your chest pain becomes worse.   You have new, unexplained symptoms.   You have nausea or vomiting.   You feel sweaty or lightheaded.   You have a cough with phlegm (sputum), or you cough up blood.  MAKE SURE YOU:   Understand these instructions.   Will watch your condition.   Will get help right away if you are not doing well or get worse.  Document Released: 09/10/2005 Document Revised: 08/30/2011 Document Reviewed: 05/07/2011 [facility] Patient Information 2012 [place], [place]. Number CT study shows that you do not have a PE.  I prescribed a muscle relaxer to enter your current medication regime.  Please follow up with Dr. Yetta Barre on Monday by phone

## 2011-11-30 NOTE — ED Notes (Signed)
Patient transported to CT 

## 2011-11-30 NOTE — Progress Notes (Signed)
Subjective:    Patient ID: Miranda [name], female    DOB: 1986/07/06, 26 y.o.   MRN: [id number]  Chest Pain  This is a recurrent problem. The current episode started 1 to 4 weeks ago. The onset quality is gradual. The problem occurs intermittently. The problem has been gradually worsening. The pain is present in the lateral region (right upper chest near the sternum). The pain is at a severity of 3/10. The pain is severe. The quality of the pain is described as stabbing. The pain does not radiate. Pertinent negatives include no abdominal pain, back pain, claudication, cough, diaphoresis, dizziness, exertional chest pressure, fever, headaches, hemoptysis, irregular heartbeat, leg pain, lower extremity edema, malaise/fatigue, nausea, near-syncope, numbness, orthopnea, palpitations, PND, shortness of breath, sputum production, syncope, vomiting or weakness. The pain is aggravated by breathing and movement. She has tried NSAIDs and analgesics for the symptoms. The treatment provided no relief.  Pertinent negatives for past medical history include no seizures.      Review of Systems  Constitutional: Negative for fever, chills, malaise/fatigue, diaphoresis, activity change, appetite change, fatigue and unexpected weight change.  HENT: Negative.   Eyes: Negative.   Respiratory: Negative for apnea, cough, hemoptysis, sputum production, choking, chest tightness, shortness of breath, wheezing and stridor.   Cardiovascular: Positive for chest pain. Negative for palpitations, orthopnea, claudication, leg swelling, syncope, PND and near-syncope.  Gastrointestinal: Negative for nausea, vomiting, abdominal pain, diarrhea, constipation, blood in stool, abdominal distention and anal bleeding.  Genitourinary: Negative.   Musculoskeletal: Negative for myalgias, back pain, joint swelling, arthralgias and gait problem.  Skin: Negative for color change, pallor, rash and wound.  Neurological: Negative for dizziness,  tremors, seizures, syncope, facial asymmetry, speech difficulty, weakness, light-headedness, numbness and headaches.  Hematological: Negative for adenopathy. Does not bruise/bleed easily.  Psychiatric/Behavioral: Negative.        Objective:   Physical Exam  Vitals reviewed. Constitutional: She is oriented to person, place, and time. She appears well-developed and well-nourished.  Non-toxic appearance. She does not have a sickly appearance. She does not appear ill. No distress.  HENT:  Head: Normocephalic and atraumatic.  Mouth/Throat: Oropharynx is clear and moist. No oropharyngeal exudate.  Eyes: Conjunctivae are normal. Right eye exhibits no discharge. Left eye exhibits no discharge. No scleral icterus.  Neck: Normal range of motion. Neck supple. No JVD present. No tracheal deviation present. No thyromegaly present.  Cardiovascular: Regular rhythm, normal heart sounds, intact distal pulses and normal pulses.  Tachycardia present.  Exam reveals no gallop and no friction rub.   No murmur heard. Pulmonary/Chest: Effort normal and breath sounds normal. No stridor. No respiratory distress. She has no wheezes. She has no rales. She exhibits no tenderness.  Abdominal: Soft. Bowel sounds are normal. She exhibits no distension and no mass. There is no tenderness. There is no rebound and no guarding.  Musculoskeletal: Normal range of motion. She exhibits no edema and no tenderness.  Lymphadenopathy:    She has no cervical adenopathy.  Neurological: She is oriented to person, place, and time.  Skin: Skin is warm and dry. No rash noted. She is not diaphoretic. No erythema. No pallor.  Psychiatric: She has a normal mood and affect. Her behavior is normal. Judgment and thought content normal.     Lab Results  Component Value Date   WBC 6.3 10/23/2011   HGB 9.0* 10/23/2011   HCT 27.9* 10/23/2011   PLT 433.0* 10/23/2011   GLUCOSE 100* 10/23/2011   CHOL 193 07/27/2011  TRIG 26.0 07/27/2011   HDL  60.50 07/27/2011   LDLCALC 127* 07/27/2011   ALT 10 10/23/2011   AST 19 10/23/2011   NA 136 10/23/2011   K 3.9 10/23/2011   CL 103 10/23/2011   CREATININE 0.6 10/23/2011   BUN 13 10/23/2011   CO2 27 10/23/2011   TSH 0.47 10/23/2011   HGBA1C 6.0 10/23/2011       Assessment & Plan:

## 2011-11-30 NOTE — ED Provider Notes (Addendum)
History     CSN: [id number]  Arrival date & time 11/30/11  1723   First MD Initiated Contact with Patient 11/30/11 1952      Chief Complaint  Patient presents with  . Possible PE      Sent by Dr. Yetta Barre for CT.   pt c/o chest pains x 2 weeks.   . Shortness of Breath    x about 6 months.    (Consider location/radiation/quality/duration/timing/severity/associated sxs/prior treatment) HPI Comments: Patient with a history of rheumatoid arthritis, who takes ibuprofen daily.  Has had more than 2 weeks of persistent tachycardia and 2 weeks of right upper chest wall pain, radiating to the right shoulder.  She was seen by Dr. Yetta Barre today who obtained a d-dimer, which was positive.  She was called back and told to come to the emergency room to have a CT scan to rule out PE  Patient is a 26 y.o. female presenting with shortness of breath. The history is provided by the patient.  Shortness of Breath  The current episode started more than 2 weeks ago. The problem occurs continuously. The problem has been unchanged. The problem is mild. The symptoms are relieved by nothing. The symptoms are aggravated by nothing. Associated symptoms include chest pain and shortness of breath. Pertinent negatives include no fever.    Past Medical History  Diagnosis Date  . ANEMIA-NOS   . Rheumatoid arthritis   . ASTHMA     History reviewed. No pertinent past surgical history.  Family History  Problem Relation Age of Onset  . Diabetes Other     History  Substance Use Topics  . Smoking status: Never Smoker   . Smokeless tobacco: Not on file  . Alcohol Use: No    OB History    Grav Para Term Preterm Abortions TAB SAB Ect Mult Living                  Review of Systems  Constitutional: Negative for fever.  Respiratory: Positive for shortness of breath.   Cardiovascular: Positive for chest pain. Negative for leg swelling.  Gastrointestinal: Negative for nausea.  Neurological: Negative for dizziness  and weakness.    Allergies  Food  Home Medications   Current Outpatient Rx  Name Route Sig Dispense Refill  . ABATACEPT 125 MG/ML Wilder SOLN Subcutaneous Inject 1 mL into the skin once a week. On mondays    . CALCIUM CARBONATE-VITAMIN D 250-125 MG-UNIT PO TABS Oral Take 1 tablet by mouth daily.      Marland Kitchen GLUCOSE BLOOD VI STRP  Use as instructed 100 each 12  . IBUPROFEN 200 MG PO TABS Oral Take 200 mg by mouth every 6 (six) hours as needed. For pain    . MULTI-VITAMIN/MINERALS PO TABS Oral Take 1 tablet by mouth daily.      . OXYCODONE-ACETAMINOPHEN 7.5-325 MG PO TABS Oral Take 1 tablet by mouth every 4 (four) hours as needed for pain. 20 tablet 0  . TRUEPLUS LANCETS 28G MISC Does not apply 1 g by Does not apply route 2 (two) times daily. 100 each 2  . ORPHENADRINE CITRATE ER 100 MG PO TB12 Oral Take 1 tablet (100 mg total) by mouth 2 (two) times daily. 20 tablet 0    BP 136/90  Pulse 97  Temp(Src) 99.3 F (37.4 C) (Oral)  Resp 18  Ht 5\' 4"  (1.626 m)  Wt 136 lb (61.689 kg)  BMI 23.34 kg/m2  SpO2 100%  LMP 11/13/2011  Physical  Exam  Constitutional: She appears well-developed.  HENT:  Head: Normocephalic.  Eyes: Pupils are equal, round, and reactive to light.  Neck: Normal range of motion.  Cardiovascular: Normal rate.   Pulmonary/Chest: Breath sounds normal. She exhibits tenderness.  Abdominal: Soft.  Skin: Skin is warm.    ED Course  Procedures (including critical care time)  Labs Reviewed  POCT I-STAT, CHEM 8 - Abnormal; Notable for the following:    Glucose, Bld 101 (*)    Hemoglobin 9.5 (*)    HCT 28.0 (*)    All other components within normal limits   Dg Chest 2 View  11/30/2011  *RADIOLOGY REPORT*  Clinical Data: Chest pain and short of breath  CHEST - 2 VIEW  Comparison:  08/05/2010  .  Findings:  The heart size and mediastinal contours are within normal limits.  Both lungs are clear.  The visualized skeletal structures are unremarkable.  IMPRESSION: No active  cardiopulmonary disease.  Original Report Authenticated By: Camelia Phenes, M.D.   Ct Angio Chest W/cm &/or Wo Cm  11/30/2011  *RADIOLOGY REPORT*  Clinical Data: Chest pain, shortness of breath, elevated D-dimer.  CT ANGIOGRAPHY CHEST  Technique:  Multidetector CT imaging of the chest using the standard protocol during bolus administration of intravenous contrast. Multiplanar reconstructed images including MIPs were obtained and reviewed to evaluate the vascular anatomy.  Contrast: OMNIPAQUE IOHEXOL 300 MG/ML IJ SOLN  Comparison: 12/10/2011 radiograph  Findings: No pulmonary arterial branch filling defect.  Normal caliber aorta.  Normal heart size.  No pleural or pericardial effusion.  Lungs are clear.  No pneumothorax.  Central airways are patent.  Limited images through the upper abdomen show no acute abnormality.  No acute osseous abnormality.  IMPRESSION: No pulmonary embolism or acute intrathoracic process identified.  Original Report Authenticated By: Waneta Martins, M.D.     1. Chest wall pain       MDM  Due to elevated d-dimer obtained by Dr. Retta Diones, and directions to have patient evaluated for PE with CT angiogram which was performed and revealed a negative PE study.  This is most likely a muscular chest wall pain.  Her d-dimer was was most likely elevated due to her rheumatoid arthritis        Arman Filter, NP 11/30/11 2256  Arman Filter, NP 01/14/12 [phone]

## 2011-11-30 NOTE — Patient Instructions (Signed)
Chest Pain (Nonspecific) It is often hard to give a specific diagnosis for the cause of chest pain. There is always a chance that your pain could be related to something serious, such as a heart attack or a blood clot in the lungs. You need to follow up with your caregiver for further evaluation. CAUSES   Heartburn.   Pneumonia or bronchitis.   Anxiety or stress.   Inflammation around your heart (pericarditis) or lung (pleuritis or pleurisy).   A blood clot in the lung.   A collapsed lung (pneumothorax). It can develop suddenly on its own (spontaneous pneumothorax) or from injury (trauma) to the chest.   Shingles infection (herpes zoster virus).  The chest wall is composed of bones, muscles, and cartilage. Any of these can be the source of the pain.  The bones can be bruised by injury.   The muscles or cartilage can be strained by coughing or overwork.   The cartilage can be affected by inflammation and become sore (costochondritis).  DIAGNOSIS  Lab tests or other studies, such as X-rays, electrocardiography, stress testing, or cardiac imaging, may be needed to find the cause of your pain.  TREATMENT   Treatment depends on what may be causing your chest pain. Treatment may include:   Acid blockers for heartburn.   Anti-inflammatory medicine.   Pain medicine for inflammatory conditions.   Antibiotics if an infection is present.   You may be advised to change lifestyle habits. This includes stopping smoking and avoiding alcohol, caffeine, and chocolate.   You may be advised to keep your head raised (elevated) when sleeping. This reduces the chance of acid going backward from your stomach into your esophagus.   Most of the time, nonspecific chest pain will improve within 2 to 3 days with rest and mild pain medicine.  HOME CARE INSTRUCTIONS   If antibiotics were prescribed, take your antibiotics as directed. Finish them even if you start to feel better.   For the next few  days, avoid physical activities that bring on chest pain. Continue physical activities as directed.   Do not smoke.   Avoid drinking alcohol.   Only take over-the-counter or prescription medicine for pain, discomfort, or fever as directed by your caregiver.   Follow your caregiver's suggestions for further testing if your chest pain does not go away.   Keep any follow-up appointments you made. If you do not go to an appointment, you could develop lasting (chronic) problems with pain. If there is any problem keeping an appointment, you must call to reschedule.  SEEK MEDICAL CARE IF:   You think you are having problems from the medicine you are taking. Read your medicine instructions carefully.   Your chest pain does not go away, even after treatment.   You develop a rash with blisters on your chest.  SEEK IMMEDIATE MEDICAL CARE IF:   You have increased chest pain or pain that spreads to your arm, neck, jaw, back, or abdomen.   You develop shortness of breath, an increasing cough, or you are coughing up blood.   You have severe back or abdominal pain, feel nauseous, or vomit.   You develop severe weakness, fainting, or chills.   You have a fever.  THIS IS AN EMERGENCY. Do not wait to see if the pain will go away. Get medical help at once. Call your local emergency services (911 in [place] Do not drive yourself to the hospital. MAKE SURE YOU:   Understand these instructions.     Will watch your condition.   Will get help right away if you are not doing well or get worse.  Document Released: 06/20/2005 Document Revised: 08/30/2011 Document Reviewed: 04/15/2008 [employer] Patient Information 2012 [employer]. 

## 2011-11-30 NOTE — ED Notes (Signed)
EKG GIVEN TO DR. WEBB 

## 2011-12-01 NOTE — ED Provider Notes (Signed)
Medical screening examination/treatment/procedure(s) were performed by non-physician practitioner and as supervising physician I was immediately available for consultation/collaboration.   Aradia Estey, MD 12/01/11 0841 

## 2011-12-02 NOTE — Assessment & Plan Note (Signed)
I will check her labs today to look for secondary causes of CP and a high heart rate

## 2011-12-02 NOTE — Assessment & Plan Note (Signed)
I will check an ESR on her today

## 2011-12-02 NOTE — Assessment & Plan Note (Signed)
She has pleuritic CP, tachycardia, and a mildly + d-dimer so I asked her to go to the ER for an urgent evaluation and probably a CT scan to look for PE

## 2011-12-02 NOTE — Assessment & Plan Note (Signed)
I will check her CBC and vitamin levels today 

## 2011-12-04 ENCOUNTER — Encounter: Payer: Self-pay | Admitting: Internal Medicine

## 2011-12-04 ENCOUNTER — Ambulatory Visit (INDEPENDENT_AMBULATORY_CARE_PROVIDER_SITE_OTHER): Payer: 59 | Admitting: Internal Medicine

## 2011-12-04 ENCOUNTER — Encounter: Payer: 59 | Attending: Internal Medicine | Admitting: *Deleted

## 2011-12-04 VITALS — BP 118/72 | HR 90 | Temp 98.5°F | Resp 16

## 2011-12-04 VITALS — Ht 64.0 in | Wt 133.6 lb

## 2011-12-04 DIAGNOSIS — T380X5A Adverse effect of glucocorticoids and synthetic analogues, initial encounter: Secondary | ICD-10-CM | POA: Insufficient documentation

## 2011-12-04 DIAGNOSIS — M069 Rheumatoid arthritis, unspecified: Secondary | ICD-10-CM

## 2011-12-04 DIAGNOSIS — E139 Other specified diabetes mellitus without complications: Secondary | ICD-10-CM | POA: Insufficient documentation

## 2011-12-04 DIAGNOSIS — R079 Chest pain, unspecified: Secondary | ICD-10-CM

## 2011-12-04 DIAGNOSIS — D509 Iron deficiency anemia, unspecified: Secondary | ICD-10-CM

## 2011-12-04 DIAGNOSIS — R000 Tachycardia, unspecified: Secondary | ICD-10-CM

## 2011-12-04 DIAGNOSIS — R630 Anorexia: Secondary | ICD-10-CM | POA: Insufficient documentation

## 2011-12-04 DIAGNOSIS — D649 Anemia, unspecified: Secondary | ICD-10-CM | POA: Insufficient documentation

## 2011-12-04 DIAGNOSIS — Z713 Dietary counseling and surveillance: Secondary | ICD-10-CM | POA: Insufficient documentation

## 2011-12-04 MED ORDER — METHYLPREDNISOLONE ACETATE PF 80 MG/ML IJ SUSP
120.0000 mg | Freq: Once | INTRAMUSCULAR | Status: AC
  Administered 2011-12-04: 120 mg via INTRAMUSCULAR

## 2011-12-04 MED ORDER — FERRALET 90 90-1 MG PO TABS: 1.0000 | ORAL_TABLET | Freq: Every day | ORAL | Status: DC

## 2011-12-04 NOTE — Progress Notes (Signed)
  Medical Nutrition Therapy:  Appt start time: 1215 end time:  1245.   Assessment:  Primary concerns today: Follow up visit for continued unintentional weight loss and anemia. Patient in a lot of pain today and very frustrated with continued weight loss and that anemia is not resolving. As we discussed her issues, it became more clear that food preparation is a hardship for her, both with her lack of energy and her lack of cooking skills. She also has a lot of foods she will not eat.  MEDICATIONS: see list, has just come off of Prednisone   DIETARY INTAKE:  Usual eating pattern includes 3 meals and 2-3 snacks per day.  Everyday foods include limited variety of most food groups. She states she is a picky eater.  Avoided foods include milk, chocolate and ice cream, many vegetables and fruits.    24-hr recall:  B ( AM): Breakfast bowl from [employer]  Snk ( AM): sunchips L ( PM): grilled chicken sandwich and potato chips Snk ( PM): almonds D ( PM): chicken, mashed potatoes in microwave meals Snk ( PM): almonds Beverages: water  Usual physical activity: not discussed at this visit  Estimated energy needs: 2000 calories 225 g carbohydrates 150 g protein 56 g fat  Progress Towards Goal(s):  In progress.   Nutritional Diagnosis:  NI-5.11.1 Predicted suboptimal nutrient intake As related to adequate iron and calorie intake.  As evidenced by anemia and desire to gain 10 pounds.    Intervention:   Encouraged her to increase her fat intake to increase calories without having to eat more food. She doesn't like any fats offered, was willing to try adding avocado as she hasn't ever tried it. Discovered that the food preparation is a large barrier, so we discussed options for her to get prepared meals delivered, consider take out at her favorite restaurants and to look for cook books that have easy recipes in them.  Plan: Check into companies' that offer prepared meals that can be ordered on  line Consider favorite restaurants and order "Take Out" , some of which can be frozen and micro-waved later Attempt to add higher fat foods to increase calories without having to eat more food volume    Provided Web Site information for [email or web address].com/  prepared foods that can be delivered to her home  Monitoring/Evaluation:  Dietary intake, exercise, restaurant meals and body weight prn.

## 2011-12-04 NOTE — Progress Notes (Signed)
Subjective:    Patient ID: Miranda [name], female    DOB: 1985/12/18, 26 y.o.   MRN: [id number]  Anemia Presents for follow-up visit. Symptoms include light-headedness, malaise/fatigue and pallor. There has been no abdominal pain, anorexia, bruising/bleeding easily, confusion, fever, leg swelling, palpitations, paresthesias, pica or weight loss. Signs of blood loss that are present include menorrhagia. Signs of blood loss that are not present include hematemesis, hematochezia, melena and vaginal bleeding. There are no compliance problems.  Compliance with medications is 51-75%.  Chest Pain  This is a recurrent problem. The current episode started more than 1 month ago. The onset quality is gradual. The problem occurs intermittently. The problem has been unchanged. Pain location: right side. The pain is at a severity of 3/10. The pain is moderate. The quality of the pain is described as pressure. The pain does not radiate. Associated symptoms include malaise/fatigue. Pertinent negatives include no abdominal pain, back pain, claudication, cough, diaphoresis, dizziness, exertional chest pressure, fever, headaches, hemoptysis, irregular heartbeat, leg pain, lower extremity edema, nausea, near-syncope, numbness, orthopnea, palpitations, PND, shortness of breath, sputum production, syncope, vomiting or weakness. The pain is aggravated by movement. Treatments tried: percocet. The treatment provided moderate relief.  Pertinent negatives for past medical history include no seizures. Prior workup: CT scan neg for PE.      Review of Systems  Constitutional: Positive for malaise/fatigue. Negative for fever, chills, weight loss, diaphoresis, activity change, appetite change, fatigue and unexpected weight change.  HENT: Negative.   Eyes: Negative.   Respiratory: Negative for apnea, cough, hemoptysis, sputum production, choking, chest tightness, shortness of breath, wheezing and stridor.   Cardiovascular:  Positive for chest pain. Negative for palpitations, orthopnea, claudication, leg swelling, syncope, PND and near-syncope.  Gastrointestinal: Negative for nausea, vomiting, abdominal pain, diarrhea, constipation, blood in stool, melena, hematochezia, abdominal distention, anal bleeding, rectal pain, anorexia and hematemesis.  Genitourinary: Positive for menorrhagia. Negative for vaginal bleeding.  Musculoskeletal: Positive for arthralgias (all medium and small joints). Negative for myalgias, back pain, joint swelling and gait problem.  Skin: Positive for pallor. Negative for color change, rash and wound.  Neurological: Positive for light-headedness. Negative for dizziness, tremors, seizures, syncope, facial asymmetry, speech difficulty, weakness, numbness, headaches and paresthesias.  Hematological: Negative for adenopathy. Does not bruise/bleed easily.  Psychiatric/Behavioral: Negative.  Negative for confusion.       Objective:   Physical Exam  Vitals reviewed. Constitutional: She is oriented to person, place, and time. She appears well-developed and well-nourished. No distress.  HENT:  Head: Normocephalic and atraumatic.  Mouth/Throat: Oropharynx is clear and moist. Mucous membranes are pale, not dry and not cyanotic. No oropharyngeal exudate, posterior oropharyngeal edema, posterior oropharyngeal erythema or tonsillar abscesses.  Eyes: Conjunctivae are normal. Right eye exhibits no discharge. Left eye exhibits discharge. No scleral icterus.  Neck: Normal range of motion. Neck supple. No JVD present. No tracheal deviation present. No thyromegaly present.  Cardiovascular: Regular rhythm, normal heart sounds and intact distal pulses.   No extrasystoles are present. Tachycardia present.  PMI is not displaced.  Exam reveals no gallop, no distant heart sounds and no friction rub.   No murmur heard. Pulses:      Carotid pulses are 1+ on the right side, and 1+ on the left side.      Radial pulses  are 1+ on the right side, and 1+ on the left side.       Femoral pulses are 1+ on the right side, and 1+ on the left  side.      Popliteal pulses are 1+ on the right side, and 1+ on the left side.       Dorsalis pedis pulses are 1+ on the right side, and 1+ on the left side.       Posterior tibial pulses are 1+ on the right side, and 1+ on the left side.  Pulmonary/Chest: Effort normal and breath sounds normal. No accessory muscle usage or stridor. Not tachypneic. No respiratory distress. She has no decreased breath sounds. She has no wheezes. She has no rhonchi. She has no rales. Chest wall is not dull to percussion. She exhibits no mass, no tenderness, no bony tenderness, no laceration, no crepitus, no edema, no deformity, no swelling and no retraction.  Abdominal: Soft. Bowel sounds are normal. She exhibits no distension and no mass. There is no tenderness. There is no rebound and no guarding.  Musculoskeletal: Normal range of motion. She exhibits tenderness. She exhibits no edema.       Right wrist: She exhibits tenderness, bony tenderness, swelling (and crepitance and nodules over the dorsum) and crepitus. She exhibits normal range of motion, no effusion, no deformity and no laceration.       Left wrist: She exhibits tenderness, bony tenderness, swelling (and crepitance and nodules over the dorsum) and crepitus. She exhibits normal range of motion, no effusion, no deformity and no laceration.  Lymphadenopathy:    She has no cervical adenopathy.  Neurological: She is oriented to person, place, and time.  Skin: Skin is warm and dry. No rash noted. She is not diaphoretic. No erythema. No pallor.  Psychiatric: She has a normal mood and affect. Her behavior is normal. Judgment and thought content normal.      Lab Results  Component Value Date   WBC 9.9 11/30/2011   HGB 9.5* 11/30/2011   HCT 28.0* 11/30/2011   PLT 498.0* 11/30/2011   GLUCOSE 101* 11/30/2011   CHOL 193 07/27/2011   TRIG 26.0 07/27/2011     HDL 60.50 07/27/2011   LDLCALC 127* 07/27/2011   ALT 10 10/23/2011   AST 19 10/23/2011   NA 140 11/30/2011   K 3.6 11/30/2011   CL 104 11/30/2011   CREATININE 0.50 11/30/2011   BUN 16 11/30/2011   CO2 27 10/23/2011   TSH 0.40 11/30/2011   HGBA1C 6.0 10/23/2011      Assessment & Plan:

## 2011-12-04 NOTE — Patient Instructions (Signed)

## 2011-12-04 NOTE — Patient Instructions (Signed)
Plan: Check into companies' that offer prepared meals that can be ordered on line Consider favorite restaurants and order "Take Out" , some of which can be frozen and micro-waved later Attempt to add higher fat foods to increase calories without having to eat more food volume

## 2011-12-07 ENCOUNTER — Encounter: Payer: Self-pay | Admitting: Internal Medicine

## 2011-12-07 NOTE — Assessment & Plan Note (Signed)
Improved today.

## 2011-12-07 NOTE — Assessment & Plan Note (Signed)
Depo-medrol IM today, continue percocet prn, f/up with her rheumatologist soon

## 2011-12-07 NOTE — Assessment & Plan Note (Signed)
This appears to be MS pain, perhaps due to her RA so I have asked her to see her Rheumatologist

## 2011-12-07 NOTE — Assessment & Plan Note (Signed)
I asked her to start taking Ferralet

## 2011-12-08 ENCOUNTER — Encounter: Payer: Self-pay | Admitting: Family Medicine

## 2011-12-08 ENCOUNTER — Ambulatory Visit (INDEPENDENT_AMBULATORY_CARE_PROVIDER_SITE_OTHER): Payer: 59 | Admitting: Family Medicine

## 2011-12-08 ENCOUNTER — Ambulatory Visit: Payer: Self-pay | Admitting: Family Medicine

## 2011-12-08 VITALS — BP 120/80 | HR 80 | Temp 98.6°F | Wt 133.0 lb

## 2011-12-08 DIAGNOSIS — R51 Headache: Secondary | ICD-10-CM

## 2011-12-08 NOTE — Progress Notes (Signed)
  Subjective:    Patient ID: [place], female    DOB: April 05, 1986, 26 y.o.   MRN: [id number]  HPI Here asking for a note to her boss at work to allow her to move to a different department. She says that another worker in her area has very bad breath, and this causes her to get nauseated and get headaches. She has never discussed this with Dr. Yetta Barre.  Review of Systems  Constitutional: Negative.   Neurological: Positive for headaches.       Objective:   Physical Exam  Constitutional: She appears well-developed and well-nourished.          Assessment & Plan:  I told her that this type of note would need to come from Dr. Yetta Barre, her primary provider. She will contact him next week

## 2011-12-13 ENCOUNTER — Encounter: Payer: Self-pay | Admitting: Internal Medicine

## 2011-12-14 ENCOUNTER — Other Ambulatory Visit (INDEPENDENT_AMBULATORY_CARE_PROVIDER_SITE_OTHER): Payer: 59

## 2011-12-14 ENCOUNTER — Ambulatory Visit (INDEPENDENT_AMBULATORY_CARE_PROVIDER_SITE_OTHER): Payer: 59 | Admitting: Internal Medicine

## 2011-12-14 ENCOUNTER — Encounter: Payer: Self-pay | Admitting: Internal Medicine

## 2011-12-14 VITALS — BP 118/70 | HR 84 | Temp 98.2°F | Resp 20 | Wt 131.0 lb

## 2011-12-14 DIAGNOSIS — D509 Iron deficiency anemia, unspecified: Secondary | ICD-10-CM

## 2011-12-14 LAB — CBC WITH DIFFERENTIAL/PLATELET
Basophils Absolute: 0 10*3/uL (ref 0.0–0.1)
Eosinophils Absolute: 0.1 10*3/uL (ref 0.0–0.7)
Hemoglobin: 9.3 g/dL — ABNORMAL LOW (ref 12.0–15.0)
Lymphocytes Relative: 33.9 % (ref 12.0–46.0)
MCHC: 31.2 g/dL (ref 30.0–36.0)
Monocytes Relative: 5.7 % (ref 3.0–12.0)
Neutrophils Relative %: 58.9 % (ref 43.0–77.0)
RBC: 3.89 Mil/uL (ref 3.87–5.11)
RDW: 17.7 % — ABNORMAL HIGH (ref 11.5–14.6)

## 2011-12-14 LAB — IBC PANEL
Iron: 31 ug/dL — ABNORMAL LOW (ref 42–145)
Saturation Ratios: 8.4 % — ABNORMAL LOW (ref 20.0–50.0)
Transferrin: 265 mg/dL (ref 212.0–360.0)

## 2011-12-14 NOTE — Patient Instructions (Signed)

## 2011-12-14 NOTE — Progress Notes (Signed)
  Subjective:    Patient ID: Miranda [name], female    DOB: 09/08/1986, 26 y.o.   MRN: [id number]  Anemia Presents for follow-up visit. Symptoms include malaise/fatigue and pallor. There has been no abdominal pain, anorexia, bruising/bleeding easily, confusion, fever, leg swelling, light-headedness, palpitations, paresthesias, pica or weight loss. Signs of blood loss that are present include menorrhagia. Signs of blood loss that are not present include hematemesis, hematochezia, melena and vaginal bleeding. There are no compliance problems.       Review of Systems  Constitutional: Positive for malaise/fatigue. Negative for fever, chills, weight loss, diaphoresis, activity change, appetite change, fatigue and unexpected weight change.  HENT: Negative.   Eyes: Negative.   Respiratory: Negative for cough, chest tightness, shortness of breath, wheezing and stridor.   Cardiovascular: Negative for chest pain, palpitations and leg swelling.  Gastrointestinal: Negative for nausea, vomiting, abdominal pain, diarrhea, blood in stool, melena, hematochezia, anal bleeding, anorexia and hematemesis.  Genitourinary: Positive for menorrhagia. Negative for vaginal bleeding.  Musculoskeletal: Negative.   Skin: Positive for pallor. Negative for color change, rash and wound.  Neurological: Negative.  Negative for light-headedness and paresthesias.  Hematological: Negative for adenopathy. Does not bruise/bleed easily.  Psychiatric/Behavioral: Negative for confusion.       Objective:   Physical Exam  Vitals reviewed. Constitutional: She is oriented to person, place, and time. She appears well-developed and well-nourished. No distress.  HENT:  Head: Normocephalic and atraumatic.  Mouth/Throat: Oropharynx is clear and moist. Mucous membranes are pale, not dry and not cyanotic. No oropharyngeal exudate, posterior oropharyngeal edema, posterior oropharyngeal erythema or tonsillar abscesses.  Eyes:  Conjunctivae are normal. Right eye exhibits no discharge. Left eye exhibits no discharge. No scleral icterus.  Neck: Normal range of motion. Neck supple. No JVD present. No tracheal deviation present. No thyromegaly present.  Cardiovascular: Normal rate, regular rhythm, normal heart sounds and intact distal pulses.  Exam reveals no gallop and no friction rub.   No murmur heard. Pulmonary/Chest: Effort normal and breath sounds normal. No stridor. No respiratory distress. She has no wheezes. She has no rales. She exhibits no tenderness.  Abdominal: Soft. Bowel sounds are normal. She exhibits no distension and no mass. There is no tenderness. There is no rebound and no guarding.  Musculoskeletal: Normal range of motion. She exhibits no edema and no tenderness.  Lymphadenopathy:    She has no cervical adenopathy.  Neurological: She is oriented to person, place, and time.  Skin: Skin is warm and dry. No rash noted. She is not diaphoretic. No erythema. No pallor.  Psychiatric: She has a normal mood and affect. Her behavior is normal. Judgment and thought content normal.      Lab Results  Component Value Date   WBC 9.9 11/30/2011   HGB 9.5* 11/30/2011   HCT 28.0* 11/30/2011   PLT 498.0* 11/30/2011   GLUCOSE 101* 11/30/2011   CHOL 193 07/27/2011   TRIG 26.0 07/27/2011   HDL 60.50 07/27/2011   LDLCALC 127* 07/27/2011   ALT 10 10/23/2011   AST 19 10/23/2011   NA 140 11/30/2011   K 3.6 11/30/2011   CL 104 11/30/2011   CREATININE 0.50 11/30/2011   BUN 16 11/30/2011   CO2 27 10/23/2011   TSH 0.40 11/30/2011   HGBA1C 6.0 10/23/2011      Assessment & Plan:

## 2011-12-15 ENCOUNTER — Encounter: Payer: Self-pay | Admitting: Internal Medicine

## 2011-12-15 LAB — RETICULOCYTES
ABS Retic: 55 10*3/uL (ref 19.0–186.0)
RBC.: 3.93 MIL/uL (ref 3.87–5.11)
Retic Ct Pct: 1.4 % (ref 0.4–2.3)

## 2011-12-18 NOTE — Assessment & Plan Note (Signed)
F/up labs today with CBC and iron level, she will see her GYN about the heavy periods

## 2012-01-01 ENCOUNTER — Encounter: Payer: Self-pay | Admitting: Internal Medicine

## 2012-01-01 ENCOUNTER — Other Ambulatory Visit (INDEPENDENT_AMBULATORY_CARE_PROVIDER_SITE_OTHER): Payer: 59

## 2012-01-01 ENCOUNTER — Ambulatory Visit (INDEPENDENT_AMBULATORY_CARE_PROVIDER_SITE_OTHER): Payer: 59 | Admitting: Internal Medicine

## 2012-01-01 VITALS — BP 132/86 | HR 80 | Temp 98.1°F | Resp 20 | Wt 128.0 lb

## 2012-01-01 DIAGNOSIS — R739 Hyperglycemia, unspecified: Secondary | ICD-10-CM

## 2012-01-01 DIAGNOSIS — R7309 Other abnormal glucose: Secondary | ICD-10-CM

## 2012-01-01 DIAGNOSIS — D509 Iron deficiency anemia, unspecified: Secondary | ICD-10-CM

## 2012-01-01 DIAGNOSIS — M069 Rheumatoid arthritis, unspecified: Secondary | ICD-10-CM

## 2012-01-01 LAB — CBC WITH DIFFERENTIAL/PLATELET
Basophils Absolute: 0 10*3/uL (ref 0.0–0.1)
Basophils Relative: 0.6 % (ref 0.0–3.0)
Eosinophils Absolute: 0 10*3/uL (ref 0.0–0.7)
HCT: 30.8 % — ABNORMAL LOW (ref 36.0–46.0)
Hemoglobin: 9.6 g/dL — ABNORMAL LOW (ref 12.0–15.0)
Lymphocytes Relative: 34.9 % (ref 12.0–46.0)
Lymphs Abs: 2.5 10*3/uL (ref 0.7–4.0)
MCHC: 31.2 g/dL (ref 30.0–36.0)
MCV: 76.5 fl — ABNORMAL LOW (ref 78.0–100.0)
Monocytes Absolute: 0.4 10*3/uL (ref 0.1–1.0)
Neutro Abs: 4.2 10*3/uL (ref 1.4–7.7)
RBC: 4.02 Mil/uL (ref 3.87–5.11)
RDW: 19.2 % — ABNORMAL HIGH (ref 11.5–14.6)

## 2012-01-01 NOTE — Progress Notes (Signed)
  Subjective:    Patient ID: Miranda [name], female    DOB: 1986/01/27, 26 y.o.   MRN: [id number]  Anemia Presents for follow-up visit. Symptoms include light-headedness, malaise/fatigue and pallor. There has been no abdominal pain, anorexia, bruising/bleeding easily, fever, leg swelling, palpitations, paresthesias, pica or weight loss. Signs of blood loss that are present include menorrhagia and vaginal bleeding. Signs of blood loss that are not present include hematemesis, hematochezia and melena. There are no compliance problems.  Compliance with medications is 76-100%.      Review of Systems  Constitutional: Positive for malaise/fatigue. Negative for fever, chills, weight loss, diaphoresis, activity change, appetite change, fatigue and unexpected weight change.  HENT: Negative.   Eyes: Negative.   Respiratory: Negative for cough, chest tightness, shortness of breath, wheezing and stridor.   Cardiovascular: Negative for chest pain, palpitations and leg swelling.  Gastrointestinal: Negative for nausea, vomiting, abdominal pain, diarrhea, constipation, blood in stool, melena, hematochezia, abdominal distention, anal bleeding, rectal pain, anorexia and hematemesis.  Genitourinary: Positive for vaginal bleeding and menorrhagia.  Musculoskeletal: Negative for myalgias, back pain, joint swelling, arthralgias and gait problem.  Skin: Positive for pallor. Negative for color change, rash and wound.  Neurological: Positive for light-headedness. Negative for dizziness, tremors, seizures, syncope, facial asymmetry, speech difficulty, weakness, numbness, headaches and paresthesias.  Hematological: Negative for adenopathy. Does not bruise/bleed easily.  Psychiatric/Behavioral: Negative.        Objective:   Physical Exam  Vitals reviewed. Constitutional: She is oriented to person, place, and time. She appears well-developed and well-nourished. No distress.  HENT:  Head: Normocephalic and  atraumatic.  Mouth/Throat: Oropharynx is clear and moist. No oropharyngeal exudate.  Eyes: Conjunctivae are normal. Right eye exhibits no discharge. Left eye exhibits no discharge. No scleral icterus.  Neck: Normal range of motion. Neck supple. No JVD present. No tracheal deviation present. No thyromegaly present.  Cardiovascular: Normal rate, regular rhythm and intact distal pulses.  Exam reveals no gallop and no friction rub.   No murmur heard. Pulmonary/Chest: Effort normal and breath sounds normal. No stridor. No respiratory distress. She has no wheezes. She has no rales. She exhibits no tenderness.  Abdominal: Soft. Bowel sounds are normal. She exhibits no distension and no mass. There is no tenderness. There is no rebound and no guarding.  Musculoskeletal: Normal range of motion. She exhibits no edema and no tenderness.  Lymphadenopathy:    She has no cervical adenopathy.  Neurological: She is oriented to person, place, and time.  Skin: Skin is warm and dry. No rash noted. She is not diaphoretic. No erythema. No pallor.  Psychiatric: She has a normal mood and affect. Her behavior is normal. Judgment and thought content normal.      Lab Results  Component Value Date   WBC 8.4 12/14/2011   HGB 9.3* 12/14/2011   HCT 29.8* 12/14/2011   PLT 487.0* 12/14/2011   GLUCOSE 101* 11/30/2011   CHOL 193 07/27/2011   TRIG 26.0 07/27/2011   HDL 60.50 07/27/2011   LDLCALC 127* 07/27/2011   ALT 10 10/23/2011   AST 19 10/23/2011   NA 140 11/30/2011   K 3.6 11/30/2011   CL 104 11/30/2011   CREATININE 0.50 11/30/2011   BUN 16 11/30/2011   CO2 27 10/23/2011   TSH 0.40 11/30/2011   HGBA1C 6.0 10/23/2011      Assessment & Plan:

## 2012-01-01 NOTE — Patient Instructions (Signed)
Anemia, Nonspecific Your exam and blood tests show you are anemic. This means your blood (hemoglobin) level is low. Normal hemoglobin values are 12 to 15 g/dL for females and 14 to 17 g/dL for males. Make a note of your hemoglobin level today. The hematocrit percent is also used to measure anemia. A normal hematocrit is 38% to 46% in females and 42% to 49% in males. Make a note of your hematocrit level today. CAUSES  Anemia can be due to many different causes.  Excessive bleeding from periods (in women).   Intestinal bleeding.   Poor nutrition.   Kidney, thyroid, liver, and bone marrow diseases.  SYMPTOMS  Anemia can come on suddenly (acute). It can also come on slowly. Symptoms can include:  Minor weakness.   Dizziness.   Palpitations.   Shortness of breath.  Symptoms may be absent until half your hemoglobin is missing if it comes on slowly. Anemia due to acute blood loss from an injury or internal bleeding may require blood transfusion if the loss is severe. Hospital care is needed if you are anemic and there is significant continual blood loss. TREATMENT   Stool tests for blood (Hemoccult) and additional lab tests are often needed. This determines the best treatment.   Further checking on your condition and your response to treatment is very important. It often takes many weeks to correct anemia.  Depending on the cause, treatment can include:  Supplements of iron.   Vitamins B12 and folic acid.   Hormone medicines.If your anemia is due to bleeding, finding the cause of the blood loss is very important. This will help avoid further problems.  SEEK IMMEDIATE MEDICAL CARE IF:   You develop fainting, extreme weakness, shortness of breath, or chest pain.   You develop heavy vaginal bleeding.   You develop bloody or black, tarry stools or vomit up blood.   You develop a high fever, rash, repeated vomiting, or dehydration.  Document Released: 10/18/2004 Document Revised:  08/30/2011 Document Reviewed: 07/26/2009 Exit[employer] Patient Information 2012 [employer]. 

## 2012-01-04 ENCOUNTER — Encounter: Payer: Self-pay | Admitting: Internal Medicine

## 2012-01-07 NOTE — Assessment & Plan Note (Signed)
She has started contraception to help with the heavy menstrual cycle, I will check her CBC and iron level today

## 2012-01-07 NOTE — Assessment & Plan Note (Signed)
I will check her a1c today 

## 2012-01-07 NOTE — Assessment & Plan Note (Signed)
Her joint pain has improved after the recent steroid injection, she has an appt soon with her rheumatologist to consider starting remicade

## 2012-01-11 ENCOUNTER — Telehealth: Payer: Self-pay | Admitting: Oncology

## 2012-01-11 NOTE — Telephone Encounter (Signed)
l[facility]vm adviisng the pt of her new appt with dr ha on 01/18/2012 and for her to call back to confirm the appts

## 2012-01-14 NOTE — ED Provider Notes (Signed)
Medical screening examination/treatment/procedure(s) were performed by non-physician practitioner and as supervising physician I was immediately available for consultation/collaboration.  Geoffery Lyons, MD 01/14/12 [phone]

## 2012-01-15 ENCOUNTER — Telehealth: Payer: Self-pay | Admitting: Oncology

## 2012-01-15 NOTE — Telephone Encounter (Signed)
lmonvm adviisng the pt of her aptt with dr Gaylyn Rong which she was referred to [place] by dr Sanda Linger. This is my second attempt to reach the pt. Will wait for her to call back

## 2012-01-18 ENCOUNTER — Ambulatory Visit: Payer: Self-pay

## 2012-01-18 ENCOUNTER — Ambulatory Visit: Payer: Self-pay | Admitting: Oncology

## 2012-01-18 ENCOUNTER — Other Ambulatory Visit: Payer: Self-pay | Admitting: Lab

## 2012-01-21 ENCOUNTER — Telehealth: Payer: Self-pay | Admitting: Oncology

## 2012-01-21 NOTE — Telephone Encounter (Signed)
Tried to contact there pt again for the third time and was unsussessful. Will send the referring office a letter with this information

## 2012-02-04 ENCOUNTER — Telehealth: Payer: Self-pay

## 2012-02-04 DIAGNOSIS — D509 Iron deficiency anemia, unspecified: Secondary | ICD-10-CM

## 2012-02-04 NOTE — Telephone Encounter (Signed)
Patient called [facility] requesting referral to the women's clinic for anemia with possible fibroids. Please advise in Dr Yetta Barre absence. Thanks

## 2012-02-04 NOTE — Telephone Encounter (Signed)
Ok for referral - which women's clinic - [facility] can make a referral.

## 2012-02-04 NOTE — Telephone Encounter (Signed)
Referral entered and [facility] to notify pt

## 2012-02-11 ENCOUNTER — Ambulatory Visit: Payer: Self-pay | Admitting: Gynecology

## 2012-02-12 ENCOUNTER — Encounter: Payer: Self-pay | Admitting: Gynecology

## 2012-02-12 ENCOUNTER — Telehealth: Payer: Self-pay | Admitting: Oncology

## 2012-02-12 ENCOUNTER — Ambulatory Visit (INDEPENDENT_AMBULATORY_CARE_PROVIDER_SITE_OTHER): Payer: 59 | Admitting: Gynecology

## 2012-02-12 VITALS — BP 120/78 | Ht 64.0 in | Wt 130.0 lb

## 2012-02-12 DIAGNOSIS — N946 Dysmenorrhea, unspecified: Secondary | ICD-10-CM

## 2012-02-12 DIAGNOSIS — D509 Iron deficiency anemia, unspecified: Secondary | ICD-10-CM

## 2012-02-12 DIAGNOSIS — D649 Anemia, unspecified: Secondary | ICD-10-CM

## 2012-02-12 DIAGNOSIS — N920 Excessive and frequent menstruation with regular cycle: Secondary | ICD-10-CM

## 2012-02-12 MED ORDER — TRANEXAMIC ACID 650 MG PO TABS: ORAL_TABLET | ORAL | Status: DC

## 2012-02-12 NOTE — Telephone Encounter (Signed)
S/w the pt and she is aware of her June appts with dr Gaylyn Rong

## 2012-02-12 NOTE — Patient Instructions (Signed)
Fibroids You have been diagnosed as having a fibroid. Fibroids are smooth muscle lumps (tumors) which can occur any place in a woman's body. They are usually in the womb (uterus). The most common problem (symptom) of fibroids is bleeding. Over time this may cause low red blood cells (anemia). Other symptoms include feelings of pressure and pain in the pelvis. The diagnosis (learning what is wrong) of fibroids is made by physical exam. Sometimes tests such as an ultrasound are used. This is helpful when fibroids are felt around the ovaries and to look for tumors. TREATMENT   Most fibroids do not need surgical or medical treatment. Sometimes a tissue sample (biopsy) of the lining of the uterus is done to rule out cancer. If there is no cancer and only a small amount of bleeding, the problem can be watched.   Hormonal treatment can improve the problem.   When surgery is needed, it can consist of removing the fibroid. Vaginal birth may not be possible after the removal of fibroids. This depends on where they are and the extent of surgery. When pregnancy occurs with fibroids it is usually normal.   Your caregiver can help decide which treatments are best for you.  HOME CARE INSTRUCTIONS   Do not use aspirin as this may increase bleeding problems.   If your periods (menses) are heavy, record the number of pads or tampons used per month. Bring this information to your caregiver. This can help them determine the best treatment for you.  SEEK IMMEDIATE MEDICAL CARE IF:  You have pelvic pain or cramps not controlled with medications, or experience a sudden increase in pain.   You have an increase of pelvic bleeding between and during menses.   You feel lightheaded or have fainting spells.   You develop worsening belly (abdominal) pain.  Document Released: 09/07/2000 Document Revised: 08/30/2011 Document Reviewed: 04/29/2008 Exit[employer] Patient Information 2012 [employer].    

## 2012-02-12 NOTE — Progress Notes (Signed)
Patient is a 26 year old gravida 0 who was referred to our practice as a courtesy of Dr. Sanda Linger who is been evaluating patient for her severe anemia. Patient for many years has suffered from menorrhagia and recently another provider had placed her on Ortho Tri-Cyclen oral contraceptive pill for which she just finished a one pack. She states that her cycles are regular but they last 5-6 days the first 2 days are very heavy with passage of large clots. Patient had been complaining when she been seeing Dr. Yetta Barre of lightheadedness, malaise and fatigue and lack of energy. Her recent labs as follows:  Hemoglobin 9.6                 total iron 36 Hematocrit 30.8                percent saturation 9.7 MCV 76.5                          ferritin 65.1 MCHC 31.2                       transparent to 65.3 RDW 19.2 Platelet count 385,000  Patient is currently on iron supplementation orally daily. She has been referred to the hematologist oncologist for IV iron infusion sometime next week.  Exam: Abdomen: Soft nontender no rebound or guarding pelvic: Bartholin urethra Skene was within normal limits Vagina: No lesions or discharge Cervix: No lesions or discharge Uterus: Anteverted normal size shape and consistency Adnexa: No palpable masses or tenderness Rectal exam: Not done  Assessment/plan: Patient with iron deficiency anemia (hypochromic microcytic) appears to be be attributed to her menorrhagia. Patient recently started on oral contraceptive pill placed by another provider. I would like her to return to the office within the next week to schedule a sonohysterogram to assess her intrauterine cavity to rule out any submucous myoma or polyps contributing to her heavier periods. If there is no evidence of submucous myoma or uterine fibroids elsewhere and the uterus we had discussed the following options: #1. Continue with a newly started oral contraceptive pill and monitor cycles over the course of the  next 3-6 months and to continue on the oral iron as well as a followup appointment the hematologist oncologist for the IV iron infusion. If she continues to have irregular heavy bleeding we had discussed her best option would be a Mirena IUD which would help contain her cycle significantly. #2 if submucous myomas are noted we will coordinate to schedule a resectoscopic myomectomy or polypectomy depending on the findings on the sonohysterogram. Patient stated that her Pap smear in April of this year was normal. Literature information was provided on fibroids as well as on the Mirena IUD. We did discuss the possibility of prescribing Lysteda on the week that she's off the oral contraceptive pill to cut down her bleeding but due to the fact the circulating levels of hormones could still be in her system could increase her risk of DVT or PE so we will not be prescribed.

## 2012-02-14 ENCOUNTER — Other Ambulatory Visit: Payer: Self-pay | Admitting: Oncology

## 2012-02-25 ENCOUNTER — Encounter: Payer: Self-pay | Admitting: Oncology

## 2012-02-25 ENCOUNTER — Other Ambulatory Visit: Payer: Self-pay | Admitting: Gynecology

## 2012-02-25 ENCOUNTER — Ambulatory Visit (INDEPENDENT_AMBULATORY_CARE_PROVIDER_SITE_OTHER): Payer: 59

## 2012-02-25 ENCOUNTER — Ambulatory Visit (HOSPITAL_BASED_OUTPATIENT_CLINIC_OR_DEPARTMENT_OTHER): Payer: 59 | Admitting: Lab

## 2012-02-25 ENCOUNTER — Other Ambulatory Visit (HOSPITAL_BASED_OUTPATIENT_CLINIC_OR_DEPARTMENT_OTHER): Payer: 59 | Admitting: Lab

## 2012-02-25 ENCOUNTER — Ambulatory Visit (HOSPITAL_BASED_OUTPATIENT_CLINIC_OR_DEPARTMENT_OTHER): Payer: 59

## 2012-02-25 ENCOUNTER — Telehealth: Payer: Self-pay | Admitting: Oncology

## 2012-02-25 ENCOUNTER — Ambulatory Visit: Payer: 59

## 2012-02-25 ENCOUNTER — Ambulatory Visit (INDEPENDENT_AMBULATORY_CARE_PROVIDER_SITE_OTHER): Payer: 59 | Admitting: Gynecology

## 2012-02-25 ENCOUNTER — Ambulatory Visit (HOSPITAL_BASED_OUTPATIENT_CLINIC_OR_DEPARTMENT_OTHER): Payer: 59 | Admitting: Oncology

## 2012-02-25 VITALS — BP 131/79 | HR 86 | Temp 97.9°F | Ht 64.0 in | Wt 129.5 lb

## 2012-02-25 DIAGNOSIS — D390 Neoplasm of uncertain behavior of uterus: Secondary | ICD-10-CM

## 2012-02-25 DIAGNOSIS — D509 Iron deficiency anemia, unspecified: Secondary | ICD-10-CM

## 2012-02-25 DIAGNOSIS — M069 Rheumatoid arthritis, unspecified: Secondary | ICD-10-CM

## 2012-02-25 DIAGNOSIS — D649 Anemia, unspecified: Secondary | ICD-10-CM

## 2012-02-25 DIAGNOSIS — N946 Dysmenorrhea, unspecified: Secondary | ICD-10-CM

## 2012-02-25 DIAGNOSIS — N920 Excessive and frequent menstruation with regular cycle: Secondary | ICD-10-CM

## 2012-02-25 DIAGNOSIS — N840 Polyp of corpus uteri: Secondary | ICD-10-CM

## 2012-02-25 DIAGNOSIS — N839 Noninflammatory disorder of ovary, fallopian tube and broad ligament, unspecified: Secondary | ICD-10-CM

## 2012-02-25 DIAGNOSIS — N854 Malposition of uterus: Secondary | ICD-10-CM

## 2012-02-25 DIAGNOSIS — R1903 Right lower quadrant abdominal swelling, mass and lump: Secondary | ICD-10-CM

## 2012-02-25 DIAGNOSIS — D3910 Neoplasm of uncertain behavior of unspecified ovary: Secondary | ICD-10-CM

## 2012-02-25 DIAGNOSIS — N83209 Unspecified ovarian cyst, unspecified side: Secondary | ICD-10-CM

## 2012-02-25 LAB — MORPHOLOGY: PLT EST: ADEQUATE

## 2012-02-25 LAB — SEDIMENTATION RATE: Sed Rate: 61 mm/hr — ABNORMAL HIGH (ref 0–22)

## 2012-02-25 LAB — CBC WITH DIFFERENTIAL/PLATELET
Eosinophils Absolute: 0.1 10*3/uL (ref 0.0–0.5)
HCT: 29.1 % — ABNORMAL LOW (ref 34.8–46.6)
LYMPH%: 37.7 % (ref 14.0–49.7)
MONO#: 0.5 10*3/uL (ref 0.1–0.9)
NEUT#: 4.3 10*3/uL (ref 1.5–6.5)
Platelets: 435 10*3/uL — ABNORMAL HIGH (ref 145–400)
RBC: 3.88 10*6/uL (ref 3.70–5.45)
WBC: 7.9 10*3/uL (ref 3.9–10.3)
lymph#: 3 10*3/uL (ref 0.9–3.3)
nRBC: 0 % (ref 0–0)

## 2012-02-25 LAB — LACTATE DEHYDROGENASE: LDH: 106 U/L (ref 94–250)

## 2012-02-25 MED ORDER — SODIUM CHLORIDE 0.9 % IV SOLN
1020.0000 mg | Freq: Once | INTRAVENOUS | Status: AC
  Administered 2012-02-25: 1020 mg via INTRAVENOUS
  Filled 2012-02-25: qty 34

## 2012-02-25 NOTE — Progress Notes (Signed)
New patient today patient accompanied by her mother, patient aware of financial assistance patient has insurance, patient does not need assistance at this time, gave patient information in case things changes.

## 2012-02-25 NOTE — Telephone Encounter (Signed)
Gv pt appts for aug-dec2013. sent pt to lab and chemo room for iron tx

## 2012-02-25 NOTE — Patient Instructions (Signed)
Ovarian Cyst The ovaries are small organs that are on each side of the uterus. The ovaries are the organs that produce the female hormones, estrogen and progesterone. An ovarian cyst is a sac filled with fluid that can vary in its size. It is normal for a small cyst to form in women who are in the childbearing age and who have menstrual periods. This type of cyst is called a follicle cyst that becomes an ovulation cyst (corpus luteum cyst) after it produces the women's egg. It later goes away on its own if the woman does not become pregnant. There are other kinds of ovarian cysts that may cause problems and may need to be treated. The most serious problem is a cyst with cancer. It should be noted that menopausal women who have an ovarian cyst are at a higher risk of it being a cancer cyst. They should be evaluated very quickly, thoroughly and followed closely. This is especially true in menopausal women because of the high rate of ovarian cancer in women in menopause. CAUSES AND TYPES OF OVARIAN CYSTS:  FUNCTIONAL CYST: The follicle/corpus luteum cyst is a functional cyst that occurs every month during ovulation with the menstrual cycle. They go away with the next menstrual cycle if the woman does not get pregnant. Usually, there are no symptoms with a functional cyst.   ENDOMETRIOMA CYST: This cyst develops from the lining of the uterus tissue. This cyst gets in or on the ovary. It grows every month from the bleeding during the menstrual period. It is also called a "chocolate cyst" because it becomes filled with blood that turns brown. This cyst can cause pain in the lower abdomen during intercourse and with your menstrual period.   CYSTADENOMA CYST: This cyst develops from the cells on the outside of the ovary. They usually are not cancerous. They can get very big and cause lower abdomen pain and pain with intercourse. This type of cyst can twist on itself, cut off its blood supply and cause severe pain.  It also can easily rupture and cause a lot of pain.   DERMOID CYST: This type of cyst is sometimes found in both ovaries. They are found to have different kinds of body tissue in the cyst. The tissue includes skin, teeth, hair, and/or cartilage. They usually do not have symptoms unless they get very big. Dermoid cysts are rarely cancerous.   POLYCYSTIC OVARY: This is a rare condition with hormone problems that produces many small cysts on both ovaries. The cysts are follicle-like cysts that never produce an egg and become a corpus luteum. It can cause an increase in body weight, infertility, acne, increase in body and facial hair and lack of menstrual periods or rare menstrual periods. Many women with this problem develop type 2 diabetes. The exact cause of this problem is unknown. A polycystic ovary is rarely cancerous.   THECA LUTEIN CYST: Occurs when too much hormone (human chorionic gonadotropin) is produced and over-stimulates the ovaries to produce an egg. They are frequently seen when doctors stimulate the ovaries for invitro-fertilization (test tube babies).   LUTEOMA CYST: This cyst is seen during pregnancy. Rarely it can cause an obstruction to the birth canal during labor and delivery. They usually go away after delivery.  SYMPTOMS   Pelvic pain or pressure.   Pain during sexual intercourse.   Increasing girth (swelling) of the abdomen.   Abnormal menstrual periods.   Increasing pain with menstrual periods.   You stop having   menstrual periods and you are not pregnant.  DIAGNOSIS  The diagnosis can be made during:  Routine or annual pelvic examination (common).   Ultrasound.   X-ray of the pelvis.   CT Scan.   MRI.   Blood tests.  TREATMENT   Treatment may only be to follow the cyst monthly for 2 to 3 months with your caregiver. Many go away on their own, especially functional cysts.   May be aspirated (drained) with a long needle with ultrasound, or by laparoscopy  (inserting a tube into the pelvis through a small incision).   The whole cyst can be removed by laparoscopy.   Sometimes the cyst may need to be removed through an incision in the lower abdomen.   Hormone treatment is sometimes used to help dissolve certain cysts.   Birth control pills are sometimes used to help dissolve certain cysts.  HOME CARE INSTRUCTIONS  Follow your caregiver's advice regarding:  Medicine.   Follow up visits to evaluate and treat the cyst.   You may need to come back or make an appointment with another caregiver, to find the exact cause of your cyst, if your caregiver is not a gynecologist.   Get your yearly and recommended pelvic examinations and Pap tests.   Let your caregiver know if you have had an ovarian cyst in the past.  SEEK MEDICAL CARE IF:   Your periods are late, irregular, they stop, or are painful.   Your stomach (abdomen) or pelvic pain does not go away.   Your stomach becomes larger or swollen.   You have pressure on your bladder or trouble emptying your bladder completely.   You have painful sexual intercourse.   You have feelings of fullness, pressure, or discomfort in your stomach.   You lose weight for no apparent reason.   You feel generally ill.   You become constipated.   You lose your appetite.   You develop acne.   You have an increase in body and facial hair.   You are gaining weight, without changing your exercise and eating habits.   You think you are pregnant.  SEEK IMMEDIATE MEDICAL CARE IF:   You have increasing abdominal pain.   You feel sick to your stomach (nausea) and/or vomit.   You develop a fever that comes on suddenly.   You develop abdominal pain during a bowel movement.  Place endometrial biopsy patient instructions here. Diagnostic Laparoscopy Laparoscopy is a surgical procedure. It is used to diagnose and treat diseases inside the belly(abdomen). It is usually a brief, common, and relatively  simple procedure. The laparoscopeis a thin, lighted, pencil-sized instrument. It is like a telescope. It is inserted into your abdomen through a small cut (incision). Your caregiver can look at the organs inside your body through this instrument. He or she can see if there is anything abnormal. Laparoscopy can be done either in a hospital or outpatient clinic. You may be given a mild sedative to help you relax before the procedure. Once in the operating room, you will be given a drug to make you sleep (general anesthesia). Laparoscopy usually lasts less than 1 hour. After the procedure, you will be monitored in a recovery area until you are stable and doing well. Once you are home, it will take 2 to 3 days to fully recover. RISKS AND COMPLICATIONS  Laparoscopy has relatively few risks. Your caregiver will discuss the risks with you before the procedure. Some problems that can occur include:  Infection.  Bleeding.   Damage to other organs.   Anesthetic side effects.  PROCEDURE Once you receive anesthesia, your surgeon inflates the abdomen with a harmless gas (carbon dioxide). This makes the organs easier to see. The laparoscope is inserted into the abdomen through a small incision. This allows your surgeon to see into the abdomen. Other small instruments are also inserted into the abdomen through other small openings. Many surgeons attach a video camera to the laparoscope to enlarge the view. During a diagnostic laparoscopy, the surgeon may be looking for inflammation, infection, or cancer. Your surgeon may take tissue samples(biopsies). The samples are sent to a specialist in looking at cells and tissue samples (pathologist). The pathologist examines them under a microscope. Biopsies can help to diagnose or confirm a disease. AFTER THE PROCEDURE   The gas is released from inside the abdomen.   The incisions are closed with stitches (sutures). Because these incisions are small (usually less than  1/2 inch), there is usually minimal discomfort after the procedure. There may be some mild discomfort in the throat. This is from the tube placed in the throat while you were sleeping. You may have some mild abdominal discomfort. There may also be discomfort from the instrument placement incisions in the abdomen.   The recovery time is shortened as long as there are no complications.   You will rest in a recovery room until stable and doing well. As long as there are no complications, you may be allowed to go home.  FINDING OUT THE RESULTS OF YOUR TEST Not all test results are available during your visit. If your test results are not back during the visit, make an appointment with your caregiver to find out the results. Do not assume everything is normal if you have not heard from your caregiver or the medical facility. It is important for you to follow up on all of your test results. HOME CARE INSTRUCTIONS   Take all medicines as directed.   Only take over-the-counter or prescription medicines for pain, discomfort, or fever as directed by your caregiver.   Resume daily activities as directed.   Showers are preferred over baths.   You may resume sexual activities in 1 week or as directed.   Do not drive while taking narcotics.  SEEK MEDICAL CARE IF:   There is increasing abdominal pain.   There is new pain in the shoulders (shoulder strap areas).   You feel lightheaded or faint.   You have the chills.   You or your child has an oral temperature above 102 F (38.9 C).   There is pus-like (purulent) drainage from any of the wounds.   You are unable to pass gas or have a bowel movement.   You feel sick to your stomach (nauseous) or throw up (vomit).  MAKE SURE YOU:   Understand these instructions.   Will watch your condition.   Will get help right away if you are not doing well or get worse.  Document Released: 12/17/2000 Document Revised: 08/30/2011 Document Reviewed:  09/10/2007 [facility] Patient Information 2012 [place], [place].

## 2012-02-25 NOTE — Progress Notes (Signed)
[facility]  Telephone:([phone]) [phone] Fax:([phone]) [phone]     INITIAL HEMATOLOGY CONSULTATION    Referral MD:   Dr. Sanda Linger, M.D.   Reason for Referral:  Microcytic anemia.     HPI:   Miranda [name] is a 26 year-old [place] American woman with history of Rheumatoid arthritis for about 10 years.  She has been on different treatments for this.  She also has menorrhagia.  She is due to see Dr. Lily Peer in the near future.  She reported that she has had chronic anemia for about 10 years.  The oldest available CBC in EPIC dated on 07/14/2010 with Hgb of 11.3.  Since then she has been more anemic.  She has been on oral iron for about 3 months without significant improvement of her Hgb.  Therefore, she was kindly referred for evaluation.   Miranda [name] presented to the clinic for the first time today with her mother.  She said that she has had chronic fatigue ever since she was diagnosed with RA.  However, within the past 6 months, she has had more fatigue, dizziness, DOE.  She has menorrhagia with q4wk menstrual cycle with each one lasting for about 5 days.  The 2nd and 3rd days are normally heavy when she needs to change her pads every 2 hours with clots.  Her menorrhagia has not improved despite taking oral contraceptive.  She has moderate joint pain in the hands/wrists/shouder from her RA.    Patient denies headache, visual changes, confusion, drenching night sweats, palpable lymph node swelling, mucositis, odynophagia, dysphagia, nausea vomiting, jaundice, chest pain, productive cough, gum bleeding, epistaxis, hematemesis, hemoptysis, abdominal pain, abdominal swelling, early satiety, melena, hematochezia, hematuria, skin rash, spontaneous bleeding, heat or cold intolerance, bowel bladder incontinence, back pain, focal motor weakness, paresthesia, depression, suicidal or homocidal ideation, feeling hopelessness.     Past Medical History  Diagnosis Date  .  ANEMIA-NOS 2005    Baseline Hgb has been in 9-10 range.   . Rheumatoid arthritis     she was on Prednisone, MTX, Embrel, Areva,.  She is currently on torafacitinib.   . ASTHMA   :    Past Surgical History  Procedure Date  . Wisdom tooth extraction   :   CURRENT MEDS: Current Outpatient Prescriptions  Medication Sig Dispense Refill  . calcium-vitamin D (OSCAL WITH D) 250-125 MG-UNIT per tablet Take 1 tablet by mouth daily.        . celecoxib (CELEBREX) 200 MG capsule Take 200 mg by mouth 2 (two) times daily.      Marland Kitchen Fe Cbn-Fe Gluc-FA-B12-C-DSS (FERRALET 90) 90-1 MG TABS Take 1 tablet by mouth daily.  30 each  11  . ibuprofen (ADVIL,MOTRIN) 200 MG tablet Take 200 mg by mouth every 6 (six) hours as needed. For pain      . Multiple Vitamins-Minerals (MULTIVITAMIN WITH MINERALS) tablet Take 1 tablet by mouth daily.        . Norgestimate-Ethinyl Estradiol Triphasic (ORTHO TRI-CYCLEN LO) 0.18/0.215/0.25 MG-25 MCG tablet Take 1 tablet by mouth daily.  1 Package  11  . Tofacitinib Citrate (XELJANZ) 5 MG TABS Take 1 tablet by mouth daily.       No current facility-administered medications for this visit.   Facility-Administered Medications Ordered in Other Visits  Medication Dose Route Frequency Provider Last Rate Last Dose  . ferumoxytol (FERAHEME) 1,020 mg in sodium chloride 0.9 % 100 mL IVPB  1,020 mg Intravenous Once Exie Parody, MD  1,020 mg at 02/25/12 1310      Allergies  Allergen Reactions  . Food     Milk makes her neck itch  . Milk-Related Compounds   :  Family History  Problem Relation Age of Onset  . Diabetes Other   . Diabetes Father   :  History   Social History  . Marital Status: Single    Spouse Name: N/A    Number of Children: 0  . Years of Education: N/A   Occupational History  .   AFB    medical reccord.    Social History Main Topics  . Smoking status: Never Smoker   . Smokeless tobacco: Never Used  . Alcohol Use: No  . Drug Use: No  .  Sexually Active: Not Currently   Other Topics Concern  . Not on file   Social History Narrative  . No narrative on file  :  REVIEW OF SYSTEM:  The rest of the 14-point review of sytem was negative.   Exam: ECOG 1  General:  Thin-appearing woman, in no acute distress.  Eyes:  no scleral icterus.  ENT:  There were no oropharyngeal lesions.  Neck was without thyromegaly.  Lymphatics:  Negative cervical, supraclavicular or axillary adenopathy.  Respiratory: lungs were clear bilaterally without wheezing or crackles.  Cardiovascular:  Regular rate and rhythm, S1/S2, without murmur, rub or gallop.  There was no pedal edema.  GI:  abdomen was soft, flat, nontender, nondistended, without organomegaly.  She deferred rectal exam.  Musculloskeletal:  no spinal tenderness of palpation of vertebral spine.  There was mild ulnar deviation of the bilateral fingers but no palpable nodules.  Skin exam was without echymosis, petichae.  Neuro exam was nonfocal.  Patient was able to get on and off exam table without assistance.  Gait was normal.  Patient was alerted and oriented.  Attention was good.   Language was appropriate.  Mood was normal without depression.  Speech was not pressured.  Thought content was not tangential.    LABS:  Lab Results  Component Value Date   WBC 7.9 02/25/2012   HGB 9.2* 02/25/2012   HCT 29.1* 02/25/2012   PLT 435* 02/25/2012   GLUCOSE 101* 11/30/2011   CHOL 193 07/27/2011   TRIG 26.0 07/27/2011   HDL 60.50 07/27/2011   LDLCALC 127* 07/27/2011   ALT 10 10/23/2011   AST 19 10/23/2011   NA 140 11/30/2011   K 3.6 11/30/2011   CL 104 11/30/2011   CREATININE 0.50 11/30/2011   BUN 16 11/30/2011   CO2 27 10/23/2011   HGBA1C 5.7 01/01/2012    Blood smear review:   I personally reviewed the patient's peripheral blood smear today.  There was anisocytosis.  There was no peripheral blast.  There were rare target cells; pencil cells; increasd central pallor.  There was no schistocytosis, spherocytosis,  target cell, rouleaux formation, tear drop cell.  There was no giant platelets or platelet clumps.      ASSESSMENT AND PLAN:   1.  Rheumatoid arthritis:  She is on tofacitinib per Rheumatology with control of her symptoms.   2.  Chronic microcytic anemia:   - Differential diagnosis:  New since 2011 making less likely hemoglobinopathy.  Her iron panel was mixed with low iron but normal ferritin.  With her RA, ferritin may be elevated in the case of iron deficiency.  Her iron deficiency is most likely due to menorrhagia.  There is low clinical suspicion for hemolysis due to  lab and blood smear review.   - Work up: I will defer hemoglobin electrophoresis until her iron has been replete.  In the future, if despite iron repletion, and she still has anemia, further work up may be considered such as  hemoglobin electrophoresis and bone marrow biopsy.  These tests have rather low yield at this time.   - Treatment:  I recommended Feraheme which may cause arthalgia/myalgia; infusion reaction.  This will help improve her iron deficiency faster than oral replacement which I advised her to continue.   3.  Follow up:  Lab at the [facility] in about 3 and 6 months.  Return visit in about 9 months.   Thank you for this referral.    The length of time of the face-to-face encounter was 30inutes. More than 50% of time was spent counseling and coordination of care.

## 2012-02-25 NOTE — Patient Instructions (Addendum)
Today you received Fereheme. This is an iron infusion used for patients with iron deficiency anemia. This medication has been administered based on Dr. Lodema Pilot recommendations. You should not have any complications after your infusion. The ultimate goal of this treatment is to increase your iron levels and facilitate the resolution of symptoms such as fatigue, headaches, or pale skin. If you have any complications or questions please feel free to contact us here at the cancer center [phone].   Thank you for letting me provide care to you today.    Iron Deficiency Anemia There are many types of anemia. Iron deficiency anemia is the most common. Iron deficiency anemia is a decrease in the number of red blood cells caused by too little iron. Without enough iron, your body does not produce enough hemoglobin. Hemoglobin is a substance in red blood cells that carries oxygen to the body's tissues. Iron deficiency anemia may leave you tired and short of breath. CAUSES   Lack of iron in the diet.   This may be seen in infants and children, because there is little iron in milk.   This may be seen in adults who do not eat enough iron-rich foods.   This may be seen in pregnant or breastfeeding women who do not take iron supplements. There is a much higher need for iron intake at these times.   Poor absorption of iron, as seen with intestinal disorders.   Intestinal bleeding.   Heavy periods.  SYMPTOMS  Mild anemia may not be noticeable. Symptoms may include:  Fatigue.   Headache.   Pale skin.   Weakness.   Shortness of breath.   Dizziness.   Cold hands and feet.   Fast or irregular heartbeat.  DIAGNOSIS  Diagnosis requires a thorough evaluation and physical exam by your caregiver.  Blood tests are generally used to confirm iron deficiency anemia.   Additional tests may be done to find the underlying cause of your anemia. These may include:   Testing for blood in the stool (fecal  occult blood test).   A procedure to see inside the colon and rectum (colonoscopy).   A procedure to see inside the esophagus and stomach (endoscopy).  TREATMENT   Correcting the cause of the iron deficiency is the first step.   Medicines, such as oral contraceptives, can make heavy menstrual flows lighter.   Antibiotics and other medicines can be used to treat peptic ulcers.   Surgery may be needed to remove a bleeding polyp, tumor, or fibroid.   Often, iron supplements (ferrous sulfate) are taken.   For the best iron absorption, take these supplements with an empty stomach.   You may need to take the supplements with food if you cannot tolerate them on an empty stomach. Vitamin C improves the absorption of iron. Your caregiver may recommend taking your iron tablets with a glass of orange juice or vitamin C supplement.   Milk and antacids should not be taken at the same time as iron supplements. They may interfere with the absorption of iron.   Iron supplements can cause constipation. A stool softener is often recommended.   Pregnant and breastfeeding women will need to take extra iron, because their normal diet usually will not provide the required amount.   Patients who cannot tolerate iron by mouth can take it through a vein (intravenously) or by an injection into the muscle.  HOME CARE INSTRUCTIONS   Ask your dietitian for help with diet questions.   Take  iron and vitamins as directed by your caregiver.   Eat a diet rich in iron. Eat liver, lean beef, whole-grain bread, eggs, dried fruit, and dark green leafy vegetables.  SEEK IMMEDIATE MEDICAL CARE IF:   You have a fainting episode. Do not drive yourself. Call your local emergency services (911 in [place] if no other help is available.   You have chest pain, nausea, or vomiting.   You develop severe or increased shortness of breath with activities.   You develop weakness or increased thirst.   You have a rapid  heartbeat.   You develop unexplained sweating or become lightheaded when getting up from a chair or bed.  MAKE SURE YOU:   Understand these instructions.   Will watch your condition.   Will get help right away if you are not doing well or get worse.  Document Released: 09/07/2000 Document Revised: 08/30/2011 Document Reviewed: 01/17/2010 [facility] Patient Information 2012 [place], [place]. Iron Deficiency Anemia  Anemia is when you have a low number of healthy red blood cells. HOME CARE   Ask your doctor or dietician what foods you should eat.   Take iron and vitamins as told by your doctor.   Eat foods that have iron in them. This includes liver, lean beef, whole-grain bread, eggs, dried fruit, and dark green leafy vegetables.  GET HELP RIGHT AWAY IF:  You pass out (faint).   You have chest pain.   You feel sick to your stomach (nauseous) or throw up (vomit).   You get very short of breath with activity.   You are weak.   You are thirstier than normal.   You have a fast heartbeat.   You start to sweat or become lightheaded when getting up from a chair or bed.  MAKE SURE YOU:  Understand these instructions.   Will watch your condition.   Will get help right away if you are not doing well or get worse.  Document Released: 10/13/2010 Document Revised: 08/30/2011 Document Reviewed: 10/13/2010 [facility] Patient Information 2012 [place], [place].

## 2012-02-25 NOTE — Progress Notes (Signed)
Patient presented to the office today for ongoing evaluation of her menorrhagia and iron deficiency anemia. See previous note dated 02/12/2012. Earlier this morning patient had IV iron infusion as a result of her severe anemia. Her recent hemoglobin had been as low as 9.6. She has finished her first pack of Ortho Tri-Cyclen oral contraceptive pill and is getting ready to start her next menstrual period. She presented to the office today for a sonohysterogram as part of her evaluation.  Ultrasound today demonstrated the following: Uterus measures 7.2 x 4.7 x 3.8 cm with an endometrial stripe of 13 mm. Uterus is retroverted with a prominent endometrial cavity with an echogenic focus located in the endometrial cavity with a dimensions of 16 x 9 mm with evidence of color flow Doppler. Right ovary with a thick wall cystic solid mass measuring 4.5 x 4.4 x 4.1 cm with positive color flow at the periphery of the ovary. Left ovary was normal. Some fluid was seen in the cul-de-sac. Sonohysterogram demonstrated a solid anterior defect measuring 19 x 11 mm.  Assessment/plan: Patient with history of menorrhagia and iron deficiency anemia currently being regulated with oral contraceptive pill and iron supplementation. Recently had IV iron infusion. Findings today on sonohysterogram with what appears to be an endometrial polyp versus submucous myoma. Also patient with a right ovarian cyst. We discussed management as follows: I would recommend we plan a laparoscopic right ovarian cystectomy along with a resectoscopic polypectomy/myomectomy at the ambulatory surgical center. Literature information was provided and we'll schedule the next few weeks. Patient will be seen a few days prior to surgery for preoperative examination. Meanwhile she will continue on her oral contraceptive pill as well as her iron supplementation.

## 2012-02-26 ENCOUNTER — Telehealth: Payer: Self-pay | Admitting: Gynecology

## 2012-02-26 NOTE — Telephone Encounter (Signed)
I called patient to schedule her outpatient surgery.  We scheduled her for Weds July 3 7:30am at [facility].  She will see Dr. Glenetta Hew for pre-op consult on July 1 at 10:30am.  She will expect a call from [facility] regarding pre-op appt for [facility].

## 2012-02-27 ENCOUNTER — Other Ambulatory Visit: Payer: Self-pay | Admitting: Gynecology

## 2012-02-27 ENCOUNTER — Telehealth: Payer: Self-pay | Admitting: *Deleted

## 2012-02-27 ENCOUNTER — Telehealth: Payer: Self-pay | Admitting: Gynecology

## 2012-02-27 DIAGNOSIS — N83209 Unspecified ovarian cyst, unspecified side: Secondary | ICD-10-CM

## 2012-02-27 NOTE — Telephone Encounter (Signed)
I will need to see her for preop consult exam. If her surgery is scheduled for more than 4 weeks from this last scan  then I would like to repeat it. Thanks

## 2012-02-27 NOTE — Telephone Encounter (Signed)
Her surgery is 25 days from her last u/s so I went ahead and scheduled an u/s prior to her pre-op consult on 03/21/12. Patient informed.

## 2012-02-27 NOTE — Telephone Encounter (Signed)
Pt left VM stating she feels worse today than she did 2 days ago when she got Iron infusion/injection.  She says she feels like she is "losing strength" and has made appt to see her PCP tomorrow.   Per Dr. Gaylyn Rong and Clenton Pare, NP,  Pt should keep appt w/ her PCP for her c/o losing strength and if she is having any muscle soreness or aching this is normal side effect of IV Iron.   Called pt back and got her VM.  Left her VM instructing her to keep her appt w/ PCP For her feeling worse and losing strength.  Informed muscle aches are normal side effect if she is experiencing this.  Also informed may take up to several months for iron to make a difference in her anemia.  Asked her to call back if any further questions/concerns.

## 2012-02-27 NOTE — Telephone Encounter (Signed)
Patient scheduled pre-op consult as you requested.  However, she calls today to tell me that you had told her you wanted to see her a few days before surgery to see if ovarian cyst was still present.  She is questioning did she need to schedule an ultrasound with her pre-op consult?  You had not indicated such in your surgery instruction note. Please advise. Thanks.

## 2012-02-28 ENCOUNTER — Encounter: Payer: Self-pay | Admitting: Internal Medicine

## 2012-02-28 ENCOUNTER — Ambulatory Visit (INDEPENDENT_AMBULATORY_CARE_PROVIDER_SITE_OTHER): Payer: 59 | Admitting: Internal Medicine

## 2012-02-28 VITALS — BP 136/80 | HR 100 | Temp 98.8°F | Resp 20 | Wt 128.0 lb

## 2012-02-28 DIAGNOSIS — M069 Rheumatoid arthritis, unspecified: Secondary | ICD-10-CM

## 2012-02-28 DIAGNOSIS — D509 Iron deficiency anemia, unspecified: Secondary | ICD-10-CM

## 2012-02-28 NOTE — Progress Notes (Signed)
Subjective:    Patient ID: Miranda [name], female    DOB: March 26, 1986, 26 y.o.   MRN: [id number]  HPI She returns today and she tells me that she got her first iron infusion 3 days ago and that since then she feels "worse" with aching and weakness. She called her Rheum yesterday and was told to stop a new med she has started for RA. She tells me that she called hematology yesterday and that they told her it was not a side effect of iron infusion and they told her to see me. She also reports that there is a "lump" below her left breast over the front of the rib cage.   Review of Systems  Constitutional: Positive for fatigue. Negative for fever, chills, diaphoresis, activity change, appetite change and unexpected weight change.  HENT: Negative.  Negative for facial swelling.   Eyes: Negative.   Respiratory: Negative for cough, chest tightness, shortness of breath, wheezing and stridor.   Cardiovascular: Negative for chest pain, palpitations and leg swelling.  Gastrointestinal: Negative for nausea, vomiting, abdominal pain, diarrhea, constipation, blood in stool and anal bleeding.  Genitourinary: Negative.   Musculoskeletal: Positive for myalgias, joint swelling (wrists) and arthralgias. Negative for back pain and gait problem.  Skin: Negative for color change, pallor, rash and wound.  Neurological: Positive for weakness. Negative for dizziness, tremors, seizures, syncope, facial asymmetry, speech difficulty, light-headedness, numbness and headaches.       Objective:   Physical Exam  Vitals reviewed. Constitutional: She is oriented to person, place, and time. She appears well-developed and well-nourished. No distress.  HENT:  Head: Normocephalic and atraumatic.  Mouth/Throat: Oropharynx is clear and moist and mucous membranes are normal. Mucous membranes are not pale, not dry and not cyanotic. No oropharyngeal exudate.  Eyes: Conjunctivae are normal. Right eye exhibits no discharge. Left  eye exhibits no discharge. No scleral icterus.  Neck: Normal range of motion. Neck supple. No JVD present. No tracheal deviation present. No thyromegaly present.  Cardiovascular: Normal rate, regular rhythm, normal heart sounds and intact distal pulses.  Exam reveals no gallop and no friction rub.   No murmur heard. Pulmonary/Chest: Effort normal and breath sounds normal. No accessory muscle usage or stridor. Not tachypneic. No respiratory distress. She has no decreased breath sounds. She has no wheezes. She has no rhonchi. She has no rales. Chest wall is not dull to percussion. She exhibits no mass, no tenderness, no bony tenderness, no laceration, no crepitus, no edema, no deformity, no swelling and no retraction. Right breast exhibits no inverted nipple, no mass, no nipple discharge, no skin change and no tenderness. Left breast exhibits no inverted nipple, no mass, no nipple discharge, no skin change and no tenderness. Breasts are symmetrical.  Abdominal: Soft. Bowel sounds are normal. She exhibits no distension and no mass. There is no tenderness. There is no rebound and no guarding.  Musculoskeletal: Normal range of motion. She exhibits no edema and no tenderness.       Right wrist: She exhibits tenderness and swelling (and warmth over the dorsum). She exhibits normal range of motion, no bony tenderness, no effusion, no crepitus, no deformity and no laceration.       Left wrist: She exhibits tenderness and swelling (and warmth over the dorsum). She exhibits normal range of motion, no bony tenderness, no effusion, no crepitus, no deformity and no laceration.  Lymphadenopathy:    She has no cervical adenopathy.  Neurological: She is oriented to person, place, and  time.  Skin: Skin is warm and dry. No rash noted. She is not diaphoretic. No erythema. No pallor.  Psychiatric: She has a normal mood and affect. Her behavior is normal. Judgment and thought content normal.     Lab Results  Component  Value Date   WBC 7.9 02/25/2012   HGB 9.2* 02/25/2012   HCT 29.1* 02/25/2012   PLT 435* 02/25/2012   GLUCOSE 101* 11/30/2011   CHOL 193 07/27/2011   TRIG 26.0 07/27/2011   HDL 60.50 07/27/2011   LDLCALC 127* 07/27/2011   ALT 10 10/23/2011   AST 19 10/23/2011   NA 140 11/30/2011   K 3.6 11/30/2011   CL 104 11/30/2011   CREATININE 0.50 11/30/2011   BUN 16 11/30/2011   CO2 27 10/23/2011   TSH 0.40 11/30/2011   HGBA1C 5.7 01/01/2012       Assessment & Plan:

## 2012-02-28 NOTE — Assessment & Plan Note (Addendum)
I told her that I don't think her symptoms have been caused by the iron infusion, I asked her to be patient about seeing any benefits from the iron infusion, if she still has concerns about the iron infusion then she will have to talk to her hematologist. By the way, I do not see anything wrong with the area that she describes on her chest wall.

## 2012-02-28 NOTE — Assessment & Plan Note (Signed)
I think her symptoms are related to her RA, it is unfortunate that she was not able to stay on the new med given to her by rheum, I asked her to stay in touch with her rheumatologist so treat the RA

## 2012-02-28 NOTE — Patient Instructions (Signed)

## 2012-03-07 ENCOUNTER — Other Ambulatory Visit: Payer: Self-pay | Admitting: *Deleted

## 2012-03-07 ENCOUNTER — Other Ambulatory Visit: Payer: Self-pay | Admitting: Internal Medicine

## 2012-03-07 ENCOUNTER — Encounter (HOSPITAL_COMMUNITY): Payer: Self-pay

## 2012-03-07 ENCOUNTER — Encounter: Payer: Self-pay | Admitting: Internal Medicine

## 2012-03-07 ENCOUNTER — Encounter (HOSPITAL_COMMUNITY)
Admission: RE | Admit: 2012-03-07 | Discharge: 2012-03-07 | Disposition: A | Discharge status: Home / Self Care | Payer: 59 | Source: Ambulatory Visit | Attending: Gynecology | Admitting: Gynecology

## 2012-03-07 ENCOUNTER — Other Ambulatory Visit (INDEPENDENT_AMBULATORY_CARE_PROVIDER_SITE_OTHER): Payer: 59

## 2012-03-07 DIAGNOSIS — D509 Iron deficiency anemia, unspecified: Secondary | ICD-10-CM

## 2012-03-07 DIAGNOSIS — Z01818 Encounter for other preprocedural examination: Secondary | ICD-10-CM | POA: Insufficient documentation

## 2012-03-07 DIAGNOSIS — D649 Anemia, unspecified: Secondary | ICD-10-CM

## 2012-03-07 DIAGNOSIS — Z01812 Encounter for preprocedural laboratory examination: Secondary | ICD-10-CM | POA: Insufficient documentation

## 2012-03-07 LAB — CBC WITH DIFFERENTIAL/PLATELET
Basophils Absolute: 0.1 10*3/uL (ref 0.0–0.1)
Basophils Relative: 1.1 % (ref 0.0–3.0)
HCT: 28.5 % — ABNORMAL LOW (ref 36.0–46.0)
Hemoglobin: 8.9 g/dL — ABNORMAL LOW (ref 12.0–15.0)
Lymphocytes Relative: 35.4 % (ref 12.0–46.0)
Lymphs Abs: 2.5 10*3/uL (ref 0.7–4.0)
MCHC: 31.3 g/dL (ref 30.0–36.0)
Monocytes Relative: 8.6 % (ref 3.0–12.0)
Neutro Abs: 3.8 10*3/uL (ref 1.4–7.7)
RBC: 3.69 Mil/uL — ABNORMAL LOW (ref 3.87–5.11)
RDW: 18.3 % — ABNORMAL HIGH (ref 11.5–14.6)

## 2012-03-07 LAB — CBC
MCH: 23.9 pg — ABNORMAL LOW (ref 26.0–34.0)
MCHC: 31.7 g/dL (ref 30.0–36.0)
MCV: 75.3 fL — ABNORMAL LOW (ref 78.0–100.0)
Platelets: 364 10*3/uL (ref 150–400)
RDW: 17.1 % — ABNORMAL HIGH (ref 11.5–15.5)

## 2012-03-07 LAB — IBC PANEL: Saturation Ratios: 8.8 % — ABNORMAL LOW (ref 20.0–50.0)

## 2012-03-07 NOTE — Patient Instructions (Addendum)
YOUR PROCEDURE IS SCHEDULED ON:03/26/12  ENTER THROUGH THE MAIN ENTRANCE OF [facility] AT:6am  USE DESK PHONE AND DIAL [phone] TO INFORM us OF YOUR ARRIVAL  CALL [phone] IF YOU HAVE ANY QUESTIONS OR PROBLEMS PRIOR TO YOUR ARRIVAL.  REMEMBER: DO NOT EAT OR DRINK AFTER MIDNIGHT :Tuesday  SPECIAL INSTRUCTIONS:   YOU MAY BRUSH YOUR TEETH THE MORNING OF SURGERY   TAKE THESE MEDICINES THE DAY OF SURGERY WITH SIP OF WATER:none   DO NOT WEAR JEWELRY, EYE MAKEUP, LIPSTICK OR DARK FINGERNAIL POLISH DO NOT WEAR LOTIONS  DO NOT SHAVE FOR 48 HOURS PRIOR TO SURGERY  YOU WILL NOT BE ALLOWED TO DRIVE YOURSELF HOME.  NAME OF DRIVER:Kathy- mother

## 2012-03-13 ENCOUNTER — Other Ambulatory Visit: Payer: 59

## 2012-03-13 DIAGNOSIS — D649 Anemia, unspecified: Secondary | ICD-10-CM

## 2012-03-14 LAB — CBC WITH DIFFERENTIAL/PLATELET
Eosinophils Relative: 2 % (ref 0–5)
HCT: 29.3 % — ABNORMAL LOW (ref 36.0–46.0)
Hemoglobin: 9.3 g/dL — ABNORMAL LOW (ref 12.0–15.0)
Lymphocytes Relative: 43 % (ref 12–46)
Lymphs Abs: 3.9 10*3/uL (ref 0.7–4.0)
MCV: 75.9 fL — ABNORMAL LOW (ref 78.0–100.0)
Monocytes Absolute: 0.5 10*3/uL (ref 0.1–1.0)
Monocytes Relative: 6 % (ref 3–12)
Neutro Abs: 4.5 10*3/uL (ref 1.7–7.7)
RDW: 18.1 % — ABNORMAL HIGH (ref 11.5–15.5)
WBC: 9.1 10*3/uL (ref 4.0–10.5)

## 2012-03-16 ENCOUNTER — Encounter (HOSPITAL_COMMUNITY): Payer: Self-pay | Admitting: Pharmacist

## 2012-03-17 ENCOUNTER — Other Ambulatory Visit: Payer: Self-pay | Admitting: Internal Medicine

## 2012-03-19 NOTE — Progress Notes (Signed)
Faxed last office visit notes to Dr. Ludwig Clarks @ [phone]; patient aware.

## 2012-03-21 ENCOUNTER — Other Ambulatory Visit: Payer: Self-pay | Admitting: *Deleted

## 2012-03-21 ENCOUNTER — Ambulatory Visit (INDEPENDENT_AMBULATORY_CARE_PROVIDER_SITE_OTHER): Payer: 59 | Admitting: Gynecology

## 2012-03-21 ENCOUNTER — Encounter: Payer: Self-pay | Admitting: Gynecology

## 2012-03-21 ENCOUNTER — Institutional Professional Consult (permissible substitution): Payer: Self-pay | Admitting: Gynecology

## 2012-03-21 ENCOUNTER — Other Ambulatory Visit: Payer: Self-pay | Admitting: Gynecology

## 2012-03-21 ENCOUNTER — Ambulatory Visit (INDEPENDENT_AMBULATORY_CARE_PROVIDER_SITE_OTHER): Payer: 59

## 2012-03-21 VITALS — BP 120/80

## 2012-03-21 DIAGNOSIS — Z01818 Encounter for other preprocedural examination: Secondary | ICD-10-CM

## 2012-03-21 DIAGNOSIS — N920 Excessive and frequent menstruation with regular cycle: Secondary | ICD-10-CM

## 2012-03-21 DIAGNOSIS — N83201 Unspecified ovarian cyst, right side: Secondary | ICD-10-CM | POA: Insufficient documentation

## 2012-03-21 DIAGNOSIS — Z3049 Encounter for surveillance of other contraceptives: Secondary | ICD-10-CM

## 2012-03-21 DIAGNOSIS — N83209 Unspecified ovarian cyst, unspecified side: Secondary | ICD-10-CM

## 2012-03-21 DIAGNOSIS — N840 Polyp of corpus uteri: Secondary | ICD-10-CM

## 2012-03-21 DIAGNOSIS — N83202 Unspecified ovarian cyst, left side: Secondary | ICD-10-CM | POA: Insufficient documentation

## 2012-03-21 MED ORDER — LEVONORGESTREL 20 MCG/24HR IU IUD: INTRAUTERINE_SYSTEM | Freq: Once | INTRAUTERINE | Status: DC

## 2012-03-21 NOTE — Progress Notes (Signed)
Patient copay for Mirena IUD is $44.

## 2012-03-21 NOTE — Progress Notes (Signed)
Miranda [name] is an 26 y.o. female. Who presented to the office today for preoperative consultation and examination. Patient is a 26-year-old who has history of severe anemia. She has suffered from menorrhagia and another provider had placed her on Ortho Tri-Cyclen oral contraceptive pill. She stated her cycles have been regular but last 6 days and typically is very heavy the first 2 days with passage of large clots. She is complaining of lightheadedness, malaise, and fatigue and lack of energy and her primary physician (Dr. Jones) had referred her to the hematologist oncologist for further evaluation of her severe anemia after the following results:  Hemoglobin 9.6 total iron 36  Hematocrit 30.8 percent saturation 9.7  MCV 76.5 ferritin 65.1  MCHC 31.2 transparent to 65.3  RDW 19.2  Platelet count 385,000  Patient has had a history of rheumatoid arthritis for 10 years. Dr. Jones her primary physician had referred her to Dr. Ha hematologist oncologist which she saw on 02/25/2012. He had recommended Feraheme which she had received IV infusion. His diagnosis was one of chronic microcytic anemia and less likely hemoglobinopathy but most likely attributed to her menorrhagia. Patient has continued on oral supplementation of iron and the following are her CBCs:  Results for [name], Miranda M (MRN [id number]) as of 03/21/2012 10:37  Ref. Range 03/07/2012 15:25 03/09/2012 02:10 03/13/2012 15:06 03/21/2012 09:36 03/21/2012 10:04  WBC Latest Range: 4.0-10.5 K/uL 7.8  9.1    RBC Latest Range: 3.87-5.11 MIL/uL 3.77 (L)  3.86 (L)    Hemoglobin Latest Range: 12.0-15.0 g/dL 9.0 (L)  9.3 (L)    HCT Latest Range: 36.0-46.0 % 28.4 (L)  29.3 (L)    MCV Latest Range: 78.0-100.0 fL 75.3 (L)  75.9 (L)    MCH Latest Range: 26.0-34.0 pg 23.9 (L)  24.1 (L)    MCHC Latest Range: 30.0-36.0 g/dL 31.7  31.7    RDW Latest Range: 11.5-15.5 % 17.1 (H)  18.1 (H)    Platelets Latest Range: 150-400 K/uL 364  443 (H)    Neutrophils  Relative Latest Range: 43-77 %   49    Lymphocytes Relative Latest Range: 12-46 %   43    Monocytes Relative Latest Range: 3-12 %   6    Eosinophils Relative Latest Range: 0-5 %   2    Basophils Relative Latest Range: 0-1 %   0    NEUT# Latest Range: 1.7-7.7 K/uL   4.5    Lymphocytes Absolute Latest Range: 0.7-4.0 K/uL   3.9    Monocytes Absolute Latest Range: 0.1-1.0 K/uL   0.5    Eosinophils Absolute Latest Range: 0.0-0.5 10e3/uL   0.2    Basophils Absolute Latest Range: 0.0-0.1 K/uL   0.0    Smear Review No range found   Criteria for review not met     Patient had an ultrasound done in the office on 02/01/2012 and had a followup ultrasound today June 17 which she was instructed to come in as part of her preoperative examination. Ultrasound report as follows:  Uterus measures 7.3 x 4.5 x 4.1 cm no major stripe of 14.7 mm. Right ovarian cyst was so solid component of blood flow in the periphery measuring 22 x 22 x 20 mm. A left ovarian cystic and solid mass with blood flow in the periphery measuring 41 x 41 x 39 mm was noted and small amount of fluid was seen in the cul-de-sac. On 02/01/2012 patient had a sonohysterogram which demonstrated a solid anterior defect measuring 19   x 11 mm suspicious for submucous myoma/polyp.  Pertinent Gynecological History: Menses: flow is excessive with use of Several pads or tampons on heaviest days Bleeding: Heavy the first 2 or 3 days Contraception: OCP (estrogen/progesterone) DES exposure: denies Blood transfusions: none Sexually transmitted diseases: no past history Previous GYN Procedures: None  Last mammogram: Not indicated Date: Not indicated Last pap: normal Date: 2012 OB History: G 0, P 0   Menstrual History: Menarche age: 10 Patient's last menstrual period was 03/02/2012.    Past Medical History  Diagnosis Date  . ASTHMA      no inhaler  . ANEMIA-NOS 2005    Baseline Hgb has been in 9-10 range.   . Rheumatoid arthritis     she was on  Prednisone, MTX, Embrel, Areva,.  She is currently on torafacitinib.     Past Surgical History  Procedure Date  . Wisdom tooth extraction     Family History  Problem Relation Age of Onset  . Diabetes Other   . Diabetes Father     Social History:  reports that she has never smoked. She has never used smokeless tobacco. She reports that she does not drink alcohol or use illicit drugs.  Allergies:  Allergies  Allergen Reactions  . Milk-Related Compounds Itching     (Not in a hospital admission)  REVIEW OF SYSTEMS: A ROS was performed and pertinent positives and negatives are included in the history.  GENERAL: No fevers or chills. HEENT: No change in vision, no earache, sore throat or sinus congestion. NECK: No pain or stiffness. CARDIOVASCULAR: No chest pain or pressure. No palpitations. PULMONARY: No shortness of breath, cough or wheeze. GASTROINTESTINAL: No abdominal pain, nausea, vomiting or diarrhea, melena or bright red blood per rectum. GENITOURINARY: No urinary frequency, urgency, hesitancy or dysuria. MUSCULOSKELETAL: Rheumatoid arthritis. DERMATOLOGIC: No rash, no itching, no lesions. ENDOCRINE: No polyuria, polydipsia, no heat or cold intolerance. No recent change in weight. HEMATOLOGICAL: Menorrhagia, chronic hypochromic microcytic anemia NEUROLOGIC: No headache, seizures, numbness, tingling or weakness. PSYCHIATRIC: No depression, no loss of interest in normal activity or change in sleep pattern.     Blood pressure 120/80, last menstrual period 03/02/2012.  Physical Exam:  HEENT:unremarkable Neck:Supple, midline, no thyroid megaly, no carotid bruits Lungs:  Clear to auscultation no rhonchi's or wheezes Heart:Regular rate and rhythm, no murmurs or gallops Breast Exam: Not examined Abdomen: Soft nontender no rebound or guarding Pelvic:BUS within normal limits Vagina: No lesions or discharge Cervix: No lesions or discharge Uterus: Retroverted normal size and  shape Adnexa: Fullness on left adnexa Extremities: No cords, no edema Rectal: Not done   Assessment/Plan: Patient with severe anemia more than likely attributed to her menorrhagia as a result of her endometrial polyp/submucous myoma. At time of workup bilateral ovarian cyst were noted highly suspicious for dermoid cyst. Patient has received iron IV and is currently on oral iron tablet. Patient scheduled to undergo a laparoscopic bilateral ovarian cystectomy along with resectoscopic polypectomy/myomectomy. The following risk of the operation were discussed with the patient:                         1. Infection (prohylactic antibiotics will be administered)  2. DVT/Pulmonary Embolism (prophylactic pneumo compression stockings will be used)  3.Trauma to internal organs requiring additional surgical procedure to repair any injury to     Internal organs requiring perhaps additional hospitalization days.  4.Hemmorhage requiring transfusion and blood products which carry risks such as               anaphylactic reaction, hepatitis and AIDS  Patient had received literature information on the procedure and [facility]  Patient will return to the office one week after her surgery to put in a Mirena IUD since she has continued heavy bleeding on the oral contraceptive pill.  Mala Gibbard HMD10:47 AMTD@    Rosi Secrist H 03/21/2012, 10:28 AM   

## 2012-03-26 ENCOUNTER — Ambulatory Visit (HOSPITAL_COMMUNITY)
Admission: RE | Admit: 2012-03-26 | Discharge: 2012-03-26 | Disposition: A | Discharge status: Home / Self Care | Payer: 59 | Source: Ambulatory Visit | Attending: Gynecology | Admitting: Gynecology

## 2012-03-26 ENCOUNTER — Encounter (HOSPITAL_COMMUNITY)
Admission: RE | Disposition: A | Discharge status: Home / Self Care | Payer: Self-pay | Source: Ambulatory Visit | Attending: Gynecology

## 2012-03-26 ENCOUNTER — Encounter (HOSPITAL_COMMUNITY): Payer: Self-pay | Admitting: Anesthesiology

## 2012-03-26 ENCOUNTER — Ambulatory Visit (HOSPITAL_COMMUNITY): Payer: 59 | Admitting: Anesthesiology

## 2012-03-26 DIAGNOSIS — J45909 Unspecified asthma, uncomplicated: Secondary | ICD-10-CM

## 2012-03-26 DIAGNOSIS — R739 Hyperglycemia, unspecified: Secondary | ICD-10-CM

## 2012-03-26 DIAGNOSIS — N83209 Unspecified ovarian cyst, unspecified side: Secondary | ICD-10-CM | POA: Insufficient documentation

## 2012-03-26 DIAGNOSIS — T380X5A Adverse effect of glucocorticoids and synthetic analogues, initial encounter: Secondary | ICD-10-CM

## 2012-03-26 DIAGNOSIS — D252 Subserosal leiomyoma of uterus: Secondary | ICD-10-CM | POA: Insufficient documentation

## 2012-03-26 DIAGNOSIS — N83202 Unspecified ovarian cyst, left side: Secondary | ICD-10-CM

## 2012-03-26 DIAGNOSIS — Z01812 Encounter for preprocedural laboratory examination: Secondary | ICD-10-CM | POA: Insufficient documentation

## 2012-03-26 DIAGNOSIS — M839 Adult osteomalacia, unspecified: Secondary | ICD-10-CM

## 2012-03-26 DIAGNOSIS — N920 Excessive and frequent menstruation with regular cycle: Secondary | ICD-10-CM

## 2012-03-26 DIAGNOSIS — Z01818 Encounter for other preprocedural examination: Secondary | ICD-10-CM | POA: Insufficient documentation

## 2012-03-26 DIAGNOSIS — D649 Anemia, unspecified: Secondary | ICD-10-CM | POA: Insufficient documentation

## 2012-03-26 DIAGNOSIS — M8580 Other specified disorders of bone density and structure, unspecified site: Secondary | ICD-10-CM

## 2012-03-26 DIAGNOSIS — N946 Dysmenorrhea, unspecified: Secondary | ICD-10-CM

## 2012-03-26 DIAGNOSIS — N840 Polyp of corpus uteri: Secondary | ICD-10-CM | POA: Insufficient documentation

## 2012-03-26 DIAGNOSIS — Z92241 Personal history of systemic steroid therapy: Secondary | ICD-10-CM

## 2012-03-26 DIAGNOSIS — D509 Iron deficiency anemia, unspecified: Secondary | ICD-10-CM

## 2012-03-26 DIAGNOSIS — N83201 Unspecified ovarian cyst, right side: Secondary | ICD-10-CM

## 2012-03-26 DIAGNOSIS — M069 Rheumatoid arthritis, unspecified: Secondary | ICD-10-CM

## 2012-03-26 HISTORY — PX: OVARIAN CYST REMOVAL: SHX89

## 2012-03-26 HISTORY — PX: LAPAROSCOPY: SHX197

## 2012-03-26 SURGERY — LAPAROSCOPY OPERATIVE
Anesthesia: General | Site: Vagina | Wound class: Clean Contaminated

## 2012-03-26 MED ORDER — ROCURONIUM BROMIDE 50 MG/5ML IV SOLN
INTRAVENOUS | Status: AC
  Filled 2012-03-26: qty 1

## 2012-03-26 MED ORDER — DEXAMETHASONE SODIUM PHOSPHATE 10 MG/ML IJ SOLN
INTRAMUSCULAR | Status: AC
  Filled 2012-03-26: qty 1

## 2012-03-26 MED ORDER — HEPARIN SODIUM (PORCINE) 5000 UNIT/ML IJ SOLN
INTRAMUSCULAR | Status: DC | PRN
  Administered 2012-03-26: 5000 [IU]

## 2012-03-26 MED ORDER — SILVER NITRATE-POT NITRATE 75-25 % EX MISC
CUTANEOUS | Status: AC
  Filled 2012-03-26: qty 2

## 2012-03-26 MED ORDER — NEOSTIGMINE METHYLSULFATE 1 MG/ML IJ SOLN
INTRAMUSCULAR | Status: DC | PRN
  Administered 2012-03-26: 2 mg via INTRAVENOUS

## 2012-03-26 MED ORDER — OXYCODONE-ACETAMINOPHEN 2.5-325 MG PO TABS: 1.0000 | ORAL_TABLET | ORAL | Status: AC | PRN

## 2012-03-26 MED ORDER — MIDAZOLAM HCL 2 MG/2ML IJ SOLN
INTRAMUSCULAR | Status: AC
  Filled 2012-03-26: qty 2

## 2012-03-26 MED ORDER — ROCURONIUM BROMIDE 100 MG/10ML IV SOLN
INTRAVENOUS | Status: DC | PRN
  Administered 2012-03-26: 30 mg via INTRAVENOUS
  Administered 2012-03-26: 5 mg via INTRAVENOUS

## 2012-03-26 MED ORDER — LACTATED RINGERS IV SOLN
INTRAVENOUS | Status: DC
  Administered 2012-03-26 (×3): via INTRAVENOUS

## 2012-03-26 MED ORDER — MIDAZOLAM HCL 5 MG/5ML IJ SOLN
INTRAMUSCULAR | Status: DC | PRN
  Administered 2012-03-26: 2 mg via INTRAVENOUS

## 2012-03-26 MED ORDER — ONDANSETRON HCL 4 MG/2ML IJ SOLN
INTRAMUSCULAR | Status: DC | PRN
  Administered 2012-03-26: 4 mg via INTRAVENOUS

## 2012-03-26 MED ORDER — BUPIVACAINE HCL (PF) 0.25 % IJ SOLN
INTRAMUSCULAR | Status: DC | PRN
  Administered 2012-03-26: 10 mL

## 2012-03-26 MED ORDER — PROMETHAZINE HCL 25 MG RE SUPP
25.0000 mg | RECTAL | Status: DC | PRN
  Administered 2012-03-26: 25 mg via RECTAL

## 2012-03-26 MED ORDER — NEOSTIGMINE METHYLSULFATE 1 MG/ML IJ SOLN
INTRAMUSCULAR | Status: AC
  Filled 2012-03-26: qty 10

## 2012-03-26 MED ORDER — GLYCOPYRROLATE 0.2 MG/ML IJ SOLN
INTRAMUSCULAR | Status: AC
  Filled 2012-03-26: qty 2

## 2012-03-26 MED ORDER — PROPOFOL 10 MG/ML IV EMUL
INTRAVENOUS | Status: DC | PRN
  Administered 2012-03-26: 170 mg via INTRAVENOUS

## 2012-03-26 MED ORDER — LIDOCAINE HCL (CARDIAC) 20 MG/ML IV SOLN
INTRAVENOUS | Status: AC
  Filled 2012-03-26: qty 5

## 2012-03-26 MED ORDER — METOCLOPRAMIDE HCL 10 MG PO TABS: 10.0000 mg | ORAL_TABLET | Freq: Three times a day (TID) | ORAL | Status: DC

## 2012-03-26 MED ORDER — MORPHINE SULFATE 4 MG/ML IJ SOLN
INTRAMUSCULAR | Status: AC
  Administered 2012-03-26: 2 mg via INTRAVENOUS
  Filled 2012-03-26: qty 1

## 2012-03-26 MED ORDER — OXYCODONE-ACETAMINOPHEN 5-325 MG PO TABS: 1.0000 | ORAL_TABLET | Freq: Once | ORAL | Status: DC

## 2012-03-26 MED ORDER — LACTATED RINGERS IR SOLN
Status: DC | PRN
  Administered 2012-03-26: 3000 mL

## 2012-03-26 MED ORDER — SODIUM CHLORIDE 0.9 % IR SOLN
Status: DC | PRN
  Administered 2012-03-26: 3000 mL

## 2012-03-26 MED ORDER — DEXTROSE 5 % IV SOLN
1.0000 g | Freq: Once | INTRAVENOUS | Status: AC
  Administered 2012-03-26: 1 g via INTRAVENOUS
  Filled 2012-03-26: qty 1

## 2012-03-26 MED ORDER — GLYCOPYRROLATE 0.2 MG/ML IJ SOLN
INTRAMUSCULAR | Status: DC | PRN
  Administered 2012-03-26: .2 mg via INTRAVENOUS

## 2012-03-26 MED ORDER — FENTANYL CITRATE 0.05 MG/ML IJ SOLN
INTRAMUSCULAR | Status: AC
  Filled 2012-03-26: qty 5

## 2012-03-26 MED ORDER — BUPIVACAINE HCL (PF) 0.25 % IJ SOLN
INTRAMUSCULAR | Status: AC
  Filled 2012-03-26: qty 30

## 2012-03-26 MED ORDER — MEPERIDINE HCL 25 MG/ML IJ SOLN: 6.2500 mg | INTRAMUSCULAR | Status: DC | PRN

## 2012-03-26 MED ORDER — SCOPOLAMINE 1 MG/3DAYS TD PT72
MEDICATED_PATCH | TRANSDERMAL | Status: AC
  Filled 2012-03-26: qty 1

## 2012-03-26 MED ORDER — FENTANYL CITRATE 0.05 MG/ML IJ SOLN
INTRAMUSCULAR | Status: DC | PRN
  Administered 2012-03-26: 50 ug via INTRAVENOUS
  Administered 2012-03-26: 25 ug via INTRAVENOUS
  Administered 2012-03-26 (×3): 50 ug via INTRAVENOUS

## 2012-03-26 MED ORDER — DEXTROSE 5 % IV SOLN
1.0000 g | INTRAVENOUS | Status: DC | PRN
  Administered 2012-03-26: 1 g via INTRAVENOUS

## 2012-03-26 MED ORDER — METHYLENE BLUE 1 % INJ SOLN
INTRAMUSCULAR | Status: AC
  Filled 2012-03-26: qty 1

## 2012-03-26 MED ORDER — LIDOCAINE HCL (CARDIAC) 20 MG/ML IV SOLN
INTRAVENOUS | Status: DC | PRN
  Administered 2012-03-26: 20 mg via INTRAVENOUS

## 2012-03-26 MED ORDER — SILVER NITRATE-POT NITRATE 75-25 % EX MISC
CUTANEOUS | Status: DC | PRN
  Administered 2012-03-26: 1

## 2012-03-26 MED ORDER — DEXAMETHASONE SODIUM PHOSPHATE 4 MG/ML IJ SOLN
INTRAMUSCULAR | Status: DC | PRN
  Administered 2012-03-26: 10 mg via INTRAVENOUS

## 2012-03-26 MED ORDER — SCOPOLAMINE 1 MG/3DAYS TD PT72
1.0000 | MEDICATED_PATCH | TRANSDERMAL | Status: DC
  Administered 2012-03-26: 1.5 mg via TRANSDERMAL

## 2012-03-26 MED ORDER — METOCLOPRAMIDE HCL 5 MG/ML IJ SOLN: 10.0000 mg | Freq: Once | INTRAMUSCULAR | Status: DC | PRN

## 2012-03-26 MED ORDER — OXYCODONE-ACETAMINOPHEN 5-325 MG PO TABS
ORAL_TABLET | ORAL | Status: AC
  Filled 2012-03-26: qty 1

## 2012-03-26 MED ORDER — ONDANSETRON HCL 4 MG/2ML IJ SOLN
INTRAMUSCULAR | Status: AC
  Filled 2012-03-26: qty 2

## 2012-03-26 MED ORDER — MORPHINE SULFATE 4 MG/ML IJ SOLN
1.0000 mg | INTRAMUSCULAR | Status: DC | PRN
  Administered 2012-03-26 (×2): 2 mg via INTRAVENOUS

## 2012-03-26 MED ORDER — PROPOFOL 10 MG/ML IV EMUL
INTRAVENOUS | Status: AC
  Filled 2012-03-26: qty 20

## 2012-03-26 SURGICAL SUPPLY — 34 items
BLADE SURG 15 STRL LF C SS BP (BLADE) ×3 IMPLANT
BLADE SURG 15 STRL SS (BLADE) ×1
CANISTER SUCTION 2500CC (MISCELLANEOUS) ×4 IMPLANT
CLOTH BEACON ORANGE TIMEOUT ST (SAFETY) ×4 IMPLANT
CONTAINER PREFILL 10% NBF 60ML (FORM) ×12 IMPLANT
CORD ACTIVE DISPOSABLE (ELECTRODE) ×1
CORD ELECTRO ACTIVE DISP (ELECTRODE) ×3 IMPLANT
DERMABOND ADVANCED (GAUZE/BANDAGES/DRESSINGS) ×1
DERMABOND ADVANCED .7 DNX12 (GAUZE/BANDAGES/DRESSINGS) ×3 IMPLANT
ELECT REM PT RETURN 9FT ADLT (ELECTROSURGICAL) ×4
ELECTRODE REM PT RTRN 9FT ADLT (ELECTROSURGICAL) ×3 IMPLANT
GLOVE BIOGEL PI IND STRL 8 (GLOVE) ×3 IMPLANT
GLOVE BIOGEL PI INDICATOR 8 (GLOVE) ×1
GLOVE ECLIPSE 7.5 STRL STRAW (GLOVE) ×8 IMPLANT
GOWN STRL REIN XL XLG (GOWN DISPOSABLE) ×16 IMPLANT
KIT HYSTEROSCOPY TRUCLEAR (ABLATOR) ×4 IMPLANT
MORCELLATOR RECIP TRUCLEAR 4.0 (ABLATOR) ×4 IMPLANT
NS IRRIG 1000ML POUR BTL (IV SOLUTION) ×4 IMPLANT
PACK LAPAROSCOPY BASIN (CUSTOM PROCEDURE TRAY) ×4 IMPLANT
PACK VAGINAL MINOR WOMEN LF (CUSTOM PROCEDURE TRAY) ×4 IMPLANT
PAD PREP 24X48 CUFFED NSTRL (MISCELLANEOUS) ×4 IMPLANT
POUCH SPECIMEN RETRIEVAL 10MM (ENDOMECHANICALS) IMPLANT
PROTECTOR NERVE ULNAR (MISCELLANEOUS) ×8 IMPLANT
SCALPEL HARMONIC ACE (MISCELLANEOUS) ×4 IMPLANT
SET IRRIG TUBING LAPAROSCOPIC (IRRIGATION / IRRIGATOR) ×4 IMPLANT
SLEEVE Z-THREAD 5X100MM (TROCAR) ×4 IMPLANT
SUT VIC AB 3-0 PS2 18 (SUTURE) ×1
SUT VIC AB 3-0 PS2 18XBRD (SUTURE) ×3 IMPLANT
SUT VICRYL 0 UR6 27IN ABS (SUTURE) ×4 IMPLANT
TOWEL OR 17X24 6PK STRL BLUE (TOWEL DISPOSABLE) ×8 IMPLANT
TRAY FOLEY CATH 14FR (SET/KITS/TRAYS/PACK) ×4 IMPLANT
TROCAR Z-THREAD FIOS 11X100 BL (TROCAR) ×4 IMPLANT
TROCAR Z-THREAD FIOS 5X100MM (TROCAR) ×4 IMPLANT
WATER STERILE IRR 1000ML POUR (IV SOLUTION) ×4 IMPLANT

## 2012-03-26 NOTE — Anesthesia Preprocedure Evaluation (Addendum)
Anesthesia Evaluation  Patient identified by MRN, date of birth, ID band Patient awake    Reviewed: Allergy & Precautions, H&P , NPO status , Patient's Chart, lab work & pertinent test results, reviewed documented beta blocker date and time   History of Anesthesia Complications Negative for: history of anesthetic complications  Airway Mallampati: II TM Distance: >3 FB Neck ROM: full    Dental  (+) Teeth Intact   Pulmonary asthma (childhood) ,  breath sounds clear to auscultation  Pulmonary exam normal       Cardiovascular Exercise Tolerance: Good negative cardio ROS  Rhythm:regular Rate:Normal     Neuro/Psych negative neurological ROS  negative psych ROS   GI/Hepatic negative GI ROS, Neg liver ROS,   Endo/Other  negative endocrine ROS  Renal/GU negative Renal ROS  Female GU complaint     Musculoskeletal  (+) Arthritis -, Rheumatoid disorders,    Abdominal   Peds  Hematology  (+) Blood dyscrasia (hgb 9.3), anemia ,   Anesthesia Other Findings   Reproductive/Obstetrics negative OB ROS                           Anesthesia Physical Anesthesia Plan  ASA: II  Anesthesia Plan: General ETT   Post-op Pain Management:    Induction:   Airway Management Planned:   Additional Equipment:   Intra-op Plan:   Post-operative Plan:   Informed Consent: I have reviewed the patients History and Physical, chart, labs and discussed the procedure including the risks, benefits and alternatives for the proposed anesthesia with the patient or authorized representative who has indicated his/her understanding and acceptance.   Dental Advisory Given  Plan Discussed with: CRNA and Surgeon  Anesthesia Plan Comments:        Anesthesia Quick Evaluation

## 2012-03-26 NOTE — Op Note (Signed)
03/26/2012  10:03 AM  PATIENT:  Miranda [name]  26 y.o. female  PRE-OPERATIVE DIAGNOSIS: Bilateral ovarian cyst, menorrhagia, anemia, intrauterine polyps  POST-OPERATIVE DIAGNOSIS: Bilateral ovarian cyst, menorrhagia, anemia, intrauterine polyps, fibroid uterus  PROCEDURE:  Procedure(s): Laparoscopic bilateral ovarian cystectomy, resectoscopic polypectomy  SURGEON:  Surgeon(s): Ok Edwards, MD Dara Lords, MD  ANESTHESIA:   general  FINDINGS: Right fundal small subserosal fibroid, lush fimbria on both distal fallopian tubes. Bilateral ovarian cysts left greater than right. Left ovarian cyst 4 x 3 cm smooth surface. A right ovarian cyst 3 cm x 3 cm smooth surface. Anterior and posterior cul-de-sac no evidence of endometriosis or adhesions. Normal-appearing appendix and smooth surface liver.  DESCRIPTION OF OPERATION: The patient was taken to the operating room where she underwent a successful general endotracheal anesthesia. Patient received a gram of Cefotan preoperatively. She had PAS stockings for DVT prophylaxis. Prior to commencement of the operation a time out was undertaken for proper identification of the patient and correct procedure to be undertaken. The patient's abdomen vagina and perineum were prepped and draped in usual sterile fashion. Bimanual examination demonstrated uterus to be retroverted. A single-tooth tenaculum was placed for manipulation during laparoscopic portion of the operation. A Foley catheter was inserted to monitor urinary output. After the drapes were in place a small semilunar incision was made in the infraumbilical region and a 10/11 mm Optiview trocar was inserted into the peritoneal cavity and a pneumoperitoneum was established with carbon dioxide for a total of 2-3 L. The patient was then placed in Trendelenburg position. 2 additional ports 5 mm in size were introduced into the right and left lower abdomen under laparoscopic guidance. Systematic  inspection with the above-mentioned findings. The left utero-ovarian ligament was grasped and placed under tension. With the use of the Harmonic Scalpel the left ovary was incised and clear fluid was extruded. The cyst wall was removed estimated histological evaluation. Hemostasis was accomplished with the Kleppinger forceps. Similar procedure was carried out on the contralateral side. Both specimens were labeled appropriately. Of note prior to bilateral ovarian cystectomy pelvic washings were obtained. The pneumoperitoneum was removed  Along with the laparoscopic sheaths. The subumbilical fascia was closed with a figure-of-eight of 0 Vicryl suture and the subcutaneous tissue was reapproximated with 3-0 Vicryl suture. All 3 port site incisions were reapproximated with Dermabond glue. For postoperative analgesia 0.25% Marcaine (10 cc) was infiltrated all 3 port sites. The patient's leg were placed in the high lithotomy position and the second portion of the operation was undertaken.  A weighted speculum was placed in the posterior vaginal vault and a Sims retractor anteriorly for exposure the cervix. A single-tooth tenaculum was placed on the anterior cervical lip after the Hulka tenaculum had been removed. The cervix was slightly dilated with a Pratt dilator to a size 27 mm. The true clear ultrasound reciprocating morcellator 4.0 was utilized with normal saline solution as the distending media. A systematic inspection of the endometrial cavity demonstrated several endometrial polyps and some attaching the anterior uterine wall some posterior uterine wall. In a systematic fashion these polyps were removed. The operating element was then changed to the true clear rotary morcellator 4.0 to completely clean out the cavity of the lush endometrium. Specimens were submitted for histological evaluation. Both tubal ostia were clear as was the endocervical canal. Pre-and post polypectomy pictures were obtained as well as on  the laparoscopic portion as well for which a copy will be kept in patients chart.  Fluid deficit was 375 cc. The single-tooth tenaculum was removed and there was bleeding from this area so a running stitch of 0 Vicryl suture was applied to the ectocervix. Sponge count needle count were correct. Patient tolerated procedure well and was extubated and transferred to recovery with stable vital signs.  ESTIMATED BLOOD LOSS: Minimal  Intake/Output Summary (Last 24 hours) at 03/26/12 1003 Last data filed at 03/26/12 0909  Gross per 24 hour  Intake   1250 ml  Output    225 ml  Net   1025 ml     BLOOD ADMINISTERED:none   LOCAL MEDICATIONS USED:  MARCAINE   0.25% subcutaneous 10 cc incision ports  SPECIMEN:  Source of Specimen:  Bilateral ovarian cyst walls along with pelvic washings and intrauterine polyps  DISPOSITION OF SPECIMEN:  PATHOLOGY  COUNTS:  YES  PLAN OF CARE: Transfer to PACU  [facility] HMD10:03 AMTD@

## 2012-03-26 NOTE — Progress Notes (Signed)
Pt up to the bathroom, in phase 2, eating crackers and drinking nauseated after a few crackers, iv fluids started, will continue to monitor and manage pain.

## 2012-03-26 NOTE — Progress Notes (Signed)
Pt is resting quietly, when awakened and asked about pain score, states it is 7/10. Pain meds given, pt back to sleep.  When questioned states pain med is not helping, will continue to monitor and manage pain.

## 2012-03-26 NOTE — Transfer of Care (Signed)
Immediate Anesthesia Transfer of Care Note  Patient: Miranda [name]  Procedure(s) Performed: Procedure(s) (LRB): LAPAROSCOPY OPERATIVE (N/A) DILATATION & CURETTAGE/HYSTEROSCOPY WITH RESECTOCOPE (N/A) OVARIAN CYSTECTOMY (Bilateral)  Patient Location: PACU  Anesthesia Type: General  Level of Consciousness: awake, oriented and patient cooperative  Airway & Oxygen Therapy: Patient Spontanous Breathing and Patient connected to nasal cannula oxygen  Post-op Assessment: Report given to PACU RN, Post -op Vital signs reviewed and stable and Patient moving all extremities X 4  Post vital signs: Reviewed and stable  Complications: No apparent anesthesia complications

## 2012-03-26 NOTE — Preoperative (Signed)
Beta Blockers   Reason not to administer Beta Blockers:Not Applicable 

## 2012-03-26 NOTE — H&P (View-Only) (Signed)
Miranda [name] is an 26 y.o. female. Who presented to the office today for preoperative consultation and examination. Patient is a 26 year old who has history of severe anemia. She has suffered from menorrhagia and another provider had placed her on Ortho Tri-Cyclen oral contraceptive pill. She stated her cycles have been regular but last 6 days and typically is very heavy the first 2 days with passage of large clots. She is complaining of lightheadedness, malaise, and fatigue and lack of energy and her primary physician (Dr. Yetta Barre) had referred her to the hematologist oncologist for further evaluation of her severe anemia after the following results:  Hemoglobin 9.6 total iron 36  Hematocrit 30.8 percent saturation 9.7  MCV 76.5 ferritin 65.1  MCHC 31.2 transparent to 65.3  RDW 19.2  Platelet count 385,000  Patient has had a history of rheumatoid arthritis for 10 years. Dr. Yetta Barre her primary physician had referred her to Dr. Gaylyn Rong hematologist oncologist which she saw on 02/25/2012. He had recommended Feraheme which she had received IV infusion. His diagnosis was one of chronic microcytic anemia and less likely hemoglobinopathy but most likely attributed to her menorrhagia. Patient has continued on oral supplementation of iron and the following are her CBCs:  Results for Miranda [name], Miranda [name] (MRN [id number]) as of 03/21/2012 10:37  Ref. Range 03/07/2012 15:25 03/09/2012 02:10 03/13/2012 15:06 03/21/2012 09:36 03/21/2012 10:04  WBC Latest Range: 4.0-10.5 K/uL 7.8  9.1    RBC Latest Range: 3.87-5.11 MIL/uL 3.77 (L)  3.86 (L)    Hemoglobin Latest Range: 12.0-15.0 g/dL 9.0 (L)  9.3 (L)    HCT Latest Range: 36.0-46.0 % 28.4 (L)  29.3 (L)    MCV Latest Range: 78.0-100.0 fL 75.3 (L)  75.9 (L)    MCH Latest Range: 26.0-34.0 pg 23.9 (L)  24.1 (L)    MCHC Latest Range: 30.0-36.0 g/dL 40.9  81.1    RDW Latest Range: 11.5-15.5 % 17.1 (H)  18.1 (H)    Platelets Latest Range: 150-400 K/uL 364  443 (H)    Neutrophils  Relative Latest Range: 43-77 %   49    Lymphocytes Relative Latest Range: 12-46 %   43    Monocytes Relative Latest Range: 3-12 %   6    Eosinophils Relative Latest Range: 0-5 %   2    Basophils Relative Latest Range: 0-1 %   0    NEUT# Latest Range: 1.7-7.7 K/uL   4.5    Lymphocytes Absolute Latest Range: 0.7-4.0 K/uL   3.9    Monocytes Absolute Latest Range: 0.1-1.0 K/uL   0.5    Eosinophils Absolute Latest Range: 0.0-0.5 10e3/uL   0.2    Basophils Absolute Latest Range: 0.0-0.1 K/uL   0.0    Smear Review No range found   Criteria for review not met     Patient had an ultrasound done in the office on 02/01/2012 and had a followup ultrasound today June 17 which she was instructed to come in as part of her preoperative examination. Ultrasound report as follows:  Uterus measures 7.3 x 4.5 x 4.1 cm no major stripe of 14.7 mm. Right ovarian cyst was so solid component of blood flow in the periphery measuring 22 x 22 x 20 mm. A left ovarian cystic and solid mass with blood flow in the periphery measuring 41 x 41 x 39 mm was noted and small amount of fluid was seen in the cul-de-sac. On 02/01/2012 patient had a sonohysterogram which demonstrated a solid anterior defect measuring 19  x 11 mm suspicious for submucous myoma/polyp.  Pertinent Gynecological History: Menses: flow is excessive with use of Several pads or tampons on heaviest days Bleeding: Heavy the first 2 or 3 days Contraception: OCP (estrogen/progesterone) DES exposure: denies Blood transfusions: none Sexually transmitted diseases: no past history Previous GYN Procedures: None  Last mammogram: Not indicated Date: Not indicated Last pap: normal Date: 2012 OB History: G 0, P 0   Menstrual History: Menarche age: 55 Patient's last menstrual period was 03/02/2012.    Past Medical History  Diagnosis Date  . ASTHMA      no inhaler  . ANEMIA-NOS 2005    Baseline Hgb has been in 9-10 range.   . Rheumatoid arthritis     she was on  Prednisone, MTX, Embrel, Areva,.  She is currently on torafacitinib.     Past Surgical History  Procedure Date  . Wisdom tooth extraction     Family History  Problem Relation Age of Onset  . Diabetes Other   . Diabetes Father     Social History:  reports that she has never smoked. She has never used smokeless tobacco. She reports that she does not drink alcohol or use illicit drugs.  Allergies:  Allergies  Allergen Reactions  . Milk-Related Compounds Itching     (Not in a hospital admission)  REVIEW OF SYSTEMS: A ROS was performed and pertinent positives and negatives are included in the history.  GENERAL: No fevers or chills. HEENT: No change in vision, no earache, sore throat or sinus congestion. NECK: No pain or stiffness. CARDIOVASCULAR: No chest pain or pressure. No palpitations. PULMONARY: No shortness of breath, cough or wheeze. GASTROINTESTINAL: No abdominal pain, nausea, vomiting or diarrhea, melena or bright red blood per rectum. GENITOURINARY: No urinary frequency, urgency, hesitancy or dysuria. MUSCULOSKELETAL: Rheumatoid arthritis. DERMATOLOGIC: No rash, no itching, no lesions. ENDOCRINE: No polyuria, polydipsia, no heat or cold intolerance. No recent change in weight. HEMATOLOGICAL: Menorrhagia, chronic hypochromic microcytic anemia NEUROLOGIC: No headache, seizures, numbness, tingling or weakness. PSYCHIATRIC: No depression, no loss of interest in normal activity or change in sleep pattern.     Blood pressure 120/80, last menstrual period 03/02/2012.  Physical Exam:  HEENT:unremarkable Neck:Supple, midline, no thyroid megaly, no carotid bruits Lungs:  Clear to auscultation no rhonchi's or wheezes Heart:Regular rate and rhythm, no murmurs or gallops Breast Exam: Not examined Abdomen: Soft nontender no rebound or guarding Pelvic:BUS within normal limits Vagina: No lesions or discharge Cervix: No lesions or discharge Uterus: Retroverted normal size and  shape Adnexa: Fullness on left adnexa Extremities: No cords, no edema Rectal: Not done   Assessment/Plan: Patient with severe anemia more than likely attributed to her menorrhagia as a result of her endometrial polyp/submucous myoma. At time of workup bilateral ovarian cyst were noted highly suspicious for dermoid cyst. Patient has received iron IV and is currently on oral iron tablet. Patient scheduled to undergo a laparoscopic bilateral ovarian cystectomy along with resectoscopic polypectomy/myomectomy. The following risk of the operation were discussed with the patient:                         1. Infection (prohylactic antibiotics will be administered)  2. DVT/Pulmonary Embolism (prophylactic pneumo compression stockings will be used)  3.Trauma to internal organs requiring additional surgical procedure to repair any injury to     Internal organs requiring perhaps additional hospitalization days.  4.Hemmorhage requiring transfusion and blood products which carry risks such as  anaphylactic reaction, hepatitis and AIDS  Patient had received literature information on the procedure and her surgery has been scheduled for July 3 at 7:30 AM at [facility] hospital.  Patient will return to the office one week after her surgery to put in a Mirena IUD since she has continued heavy bleeding on the oral contraceptive pill.  [facility] HMD10:47 AMTD@    Reynaldo Minium H 03/21/2012, 10:28 AM

## 2012-03-26 NOTE — Anesthesia Postprocedure Evaluation (Signed)
  Anesthesia Post-op Note  Patient: Miranda [name]  Procedure(s) Performed: Procedure(s) (LRB): LAPAROSCOPY OPERATIVE (N/A) DILATATION & CURETTAGE/HYSTEROSCOPY WITH RESECTOCOPE (N/A) OVARIAN CYSTECTOMY (Bilateral)  Patient Location: PACU  Anesthesia Type: General  Level of Consciousness: awake, alert  and oriented  Airway and Oxygen Therapy: Patient Spontanous Breathing  Post-op Pain: mild  Post-op Assessment: Post-op Vital signs reviewed, Patient's Cardiovascular Status Stable, Respiratory Function Stable, Patent Airway, No signs of Nausea or vomiting and Pain level controlled  Post-op Vital Signs: Reviewed and stable  Complications: No apparent anesthesia complications

## 2012-03-26 NOTE — Interval H&P Note (Signed)
History and Physical Interval Note:  03/26/2012 6:54 AM  Miranda [name]  has presented today for surgery, with the diagnosis of OVARIAN CYST, MYOMA  The various methods of treatment have been discussed with the patient and family. After consideration of risks, benefits and other options for treatment, the patient has consented to  Procedure(s) (LRB): LAPAROSCOPY OPERATIVE (Right) DILATATION & CURETTAGE/HYSTEROSCOPY WITH RESECTOCOPE (N/A) as a surgical intervention .  The patient's history has been reviewed, patient examined, no change in status, stable for surgery.  I have reviewed the patients' chart and labs.  Questions were answered to the patient's satisfaction.     Ok Edwards

## 2012-03-27 ENCOUNTER — Encounter (HOSPITAL_COMMUNITY): Payer: Self-pay | Admitting: Gynecology

## 2012-03-29 MED FILL — Heparin Sodium (Porcine) Inj 5000 Unit/ML: INTRAMUSCULAR | Qty: 1 | Status: AC

## 2012-04-08 ENCOUNTER — Ambulatory Visit (INDEPENDENT_AMBULATORY_CARE_PROVIDER_SITE_OTHER): Payer: 59 | Admitting: Gynecology

## 2012-04-08 ENCOUNTER — Ambulatory Visit: Payer: 59 | Admitting: Gynecology

## 2012-04-08 ENCOUNTER — Encounter: Payer: Self-pay | Admitting: Gynecology

## 2012-04-08 VITALS — BP 120/82

## 2012-04-08 DIAGNOSIS — Z9889 Other specified postprocedural states: Secondary | ICD-10-CM

## 2012-04-08 DIAGNOSIS — D219 Benign neoplasm of connective and other soft tissue, unspecified: Secondary | ICD-10-CM

## 2012-04-08 DIAGNOSIS — D259 Leiomyoma of uterus, unspecified: Secondary | ICD-10-CM

## 2012-04-08 NOTE — Progress Notes (Signed)
       Miranda [name] is an 26 y.o. female who presented to the office today for a postop visit. Patient is status post laparoscopic bilateral ovarian cystectomy resectoscopic polypectomy on July 3 as a result of bilateral ovarian cyst, menorrhagia, anemia and intrauterine polyps. Patient is doing well postoperatively she is on Ortho Tri-Cyclen 26 year old contraceptive pill. She is also on iron supplementation. Prior to her surgery the following were her labs:   Hemoglobin 9.6 total iron 36  Hematocrit 30.8 percent saturation 9.7  MCV 76.5 ferritin 65.1  MCHC 31.2 transparent to 65.3  RDW 19.2  Platelet count 385,000   Her pathology report from her recent surgery as follows:  Diagnosis 1. Ovary, cyst, right - HEMORRHAGIC CORPUS LUTEAL CYST, NO ATYPIA OR MALIGNANCY. 2. Ovary, cyst, left - HEMORRHAGIC CORPUS LUTEAL CYST. NO ATYPIA OR MALIGNANCY. 3. Endometrial polyp - BENIGN ENDOMETRIAL POLYP AND ADJACENT BENIGN SECRETORY ENDOMETRIUM, NO ATYPIA, HYPERPLASIA OR MALIGNANCY. - UNDERLYING UNREMARKABLE MYOMETRIUM. - SMALL POLYPOID FRAGMENT OF BENIGN ENDOCERVICAL MUCOSA, NO DYSPLASIA OR MALIGNANCY.  Exam: Abdomen: Soft nontender no rebound or guarding port sites healing. Pelvic: Bartholin urethra Skene was within normal limits Vagina: No lesions or discharge Cervix: No lesions or discharge Uterus: Anteverted normal size shape and consistency Adnexa: No palpable masses or tenderness Rectal exam: Not done  Assessment/plan: Patient status post laparoscopic bilateral ovarian cystectomy and resectoscopic polypectomy. Patient with menorrhagia and anemia currently on oral contraceptive pill. She would like to wait for 3 more months before opting per my recommendation for a Mirena IUD. She will continue her iron supplementation and followup with Dr. Yetta Barre her primary physician. She's otherwise scheduled to return back to the office next April for her annual gynecological  examination.

## 2012-04-09 ENCOUNTER — Ambulatory Visit: Payer: 59 | Admitting: Gynecology

## 2012-04-14 ENCOUNTER — Other Ambulatory Visit: Payer: Self-pay | Admitting: *Deleted

## 2012-04-14 MED ORDER — GNP SUPER THIN LANCETS 30G MISC: 30.0000 g | Freq: Two times a day (BID) | Status: DC

## 2012-04-25 ENCOUNTER — Telehealth: Payer: Self-pay | Admitting: *Deleted

## 2012-04-25 ENCOUNTER — Other Ambulatory Visit (HOSPITAL_BASED_OUTPATIENT_CLINIC_OR_DEPARTMENT_OTHER): Payer: 59 | Admitting: Lab

## 2012-04-25 DIAGNOSIS — M069 Rheumatoid arthritis, unspecified: Secondary | ICD-10-CM

## 2012-04-25 DIAGNOSIS — D509 Iron deficiency anemia, unspecified: Secondary | ICD-10-CM

## 2012-04-25 LAB — CBC WITH DIFFERENTIAL/PLATELET
Basophils Absolute: 0 10*3/uL (ref 0.0–0.1)
EOS%: 0.1 % (ref 0.0–7.0)
Eosinophils Absolute: 0 10*3/uL (ref 0.0–0.5)
HCT: 30.4 % — ABNORMAL LOW (ref 34.8–46.6)
HGB: 9.7 g/dL — ABNORMAL LOW (ref 11.6–15.9)
MCH: 25.9 pg (ref 25.1–34.0)
MCV: 80.8 fL (ref 79.5–101.0)
MONO%: 2.4 % (ref 0.0–14.0)
NEUT#: 7.9 10*3/uL — ABNORMAL HIGH (ref 1.5–6.5)
NEUT%: 72.5 % (ref 38.4–76.8)
RDW: 21.9 % — ABNORMAL HIGH (ref 11.2–14.5)

## 2012-04-25 NOTE — Telephone Encounter (Signed)
Message copied by Wende Mott on Fri Apr 25, 2012 12:08 PM ------      Message from: Jethro Bolus T      Created: Fri Apr 25, 2012  9:09 AM       Please call pt.  Her anemia of iron deficiency and inflammation (from rheumatoid arthritis) has slightly improved.  Continue oral iron along with VitC as tolerated.  Thanks.

## 2012-04-25 NOTE — Telephone Encounter (Signed)
Called pt and left her VM to return call so I can give her lab results.

## 2012-05-02 ENCOUNTER — Ambulatory Visit: Payer: 59 | Admitting: Internal Medicine

## 2012-05-02 DIAGNOSIS — Z0289 Encounter for other administrative examinations: Secondary | ICD-10-CM

## 2012-05-08 ENCOUNTER — Ambulatory Visit (INDEPENDENT_AMBULATORY_CARE_PROVIDER_SITE_OTHER)
Admission: RE | Admit: 2012-05-08 | Discharge: 2012-05-08 | Disposition: A | Discharge status: Home / Self Care | Payer: 59 | Source: Ambulatory Visit | Attending: Internal Medicine | Admitting: Internal Medicine

## 2012-05-08 ENCOUNTER — Ambulatory Visit (INDEPENDENT_AMBULATORY_CARE_PROVIDER_SITE_OTHER): Payer: 59 | Admitting: Internal Medicine

## 2012-05-08 ENCOUNTER — Encounter: Payer: Self-pay | Admitting: Internal Medicine

## 2012-05-08 VITALS — BP 116/80 | HR 80 | Temp 98.1°F | Resp 16 | Wt 136.2 lb

## 2012-05-08 DIAGNOSIS — M069 Rheumatoid arthritis, unspecified: Secondary | ICD-10-CM

## 2012-05-08 DIAGNOSIS — M25539 Pain in unspecified wrist: Secondary | ICD-10-CM

## 2012-05-08 DIAGNOSIS — M25531 Pain in right wrist: Secondary | ICD-10-CM

## 2012-05-08 NOTE — Patient Instructions (Signed)
Rheumatoid Arthritis  Rheumatoid arthritis is a long-term (chronic) inflammatory disease that causes pain, swelling, and stiffness of the joints. It can affect the entire body. The effects of rheumatoid arthritis vary widely among those with the condition.  CAUSES   The cause of rheumatoid arthritis is not known. It tends to run in families and is more common in women. Certain cells of the body's natural defense system (immune system) do not work properly and begin to attack healthy joints. It primarily involves the connective tissue that lines the joints (synovial membrane). This can cause damage to the joint.  SYMPTOMS    Pain, stiffness, swelling, and decreased motion of many joints, especially in the hands and feet.   Stiffness that is worse in the morning. It may last 1 to 2 hours or longer.   Fatigue.   Loss of appetite.   Low-grade fever.   Dry eyes and mouth.   Firm lumps (rheumatoid nodules) that grow beneath the skin in areas such as the elbows and hands.  DIAGNOSIS   Diagnosis is based on the symptoms described, exam, and blood tests. Sometimes, X-rays are helpful.  TREATMENT   The goals of treatment are to relieve pain, reduce inflammation, and slow down or stop joint damage and disability. Methods vary and may include:   Maintaining a balance of rest, exercise, and proper nutrition.   Medicines:   Pain relievers (analgesics).   Corticosteroids and nonsteroidal anti-inflammatory drugs (NSAIDs) to reduce inflammation.   Disease-modifying antirheumatic drugs (DMARDs) to try to slow the course of the disease.   Biologic response modifiers to reduce inflammation and damage.   Surgery for patients with severe joint damage. Joint replacement or fusing of joints may be needed.   Routine monitoring and ongoing care, such as office visits, blood and urine tests, and x-rays.  HOME CARE INSTRUCTIONS    Remain physically active and reduce activity when the disease gets worse.   Eat a well-balanced  diet.   Use heat on affected joints when you wake up and before activities.   Use ice on affected joints following activities or exercising.   Take all medicines and supplements as directed by your caregiver.   Use splints as your caregiver prescribes. Splints help maintain joint position and function.   Do not sleep with pillows under your knees. This may lead to spasms.   Participate in a self-management program to keep current with the latest treatment and coping skills.  SEEK IMMEDIATE MEDICAL CARE IF:   You have fainting episodes.   You have periods of extreme weakness.   A hot, painful joint develops rapidly and is more severe than usual joint aches.   You develop chills.   You have a fever.  FOR MORE INFORMATION   American College of Rheumatology: www.rheumatology.org  Arthritis Foundation: www.arthritis.org  Document Released: 09/07/2000 Document Revised: 08/30/2011 Document Reviewed: 12/22/2009  [employer] Patient Information 2012 [employer].

## 2012-05-09 ENCOUNTER — Encounter: Payer: Self-pay | Admitting: Internal Medicine

## 2012-05-09 NOTE — Assessment & Plan Note (Signed)
Ortho referral  

## 2012-05-09 NOTE — Progress Notes (Signed)
  Subjective:    Patient ID: Miranda [name], female    DOB: 16-Dec-1985, 26 y.o.   MRN: [id number]  HPI  She returns and requests that I refer her to an ortho to consider surgery on her right wrist - she has been seeing Dr. Mina [name] but he is not on her insurance and she needs a new option. She has been taking percocet, steroids, celebrex, and TENS unit. She saw her rheumatologist yesterday and will try a new a med for RA soon but that apparently it requires a PA application.  Review of Systems  Constitutional: Negative.   HENT: Negative.   Eyes: Negative.   Respiratory: Negative.   Cardiovascular: Negative.   Gastrointestinal: Negative.   Genitourinary: Negative.   Musculoskeletal: Positive for arthralgias. Negative for myalgias, back pain, joint swelling and gait problem.  Skin: Negative.   Neurological: Negative.   Hematological: Negative.   Psychiatric/Behavioral: Negative.        Objective:   Physical Exam  Musculoskeletal:       Right wrist: She exhibits decreased range of motion, tenderness and swelling. She exhibits no bony tenderness, no effusion, no crepitus, no deformity and no laceration.          Assessment & Plan:

## 2012-05-09 NOTE — Assessment & Plan Note (Signed)
Continue meds for pain, refer to ortho

## 2012-05-21 ENCOUNTER — Other Ambulatory Visit: Payer: Self-pay | Admitting: Orthopedic Surgery

## 2012-05-21 ENCOUNTER — Encounter (HOSPITAL_BASED_OUTPATIENT_CLINIC_OR_DEPARTMENT_OTHER): Payer: Self-pay | Admitting: *Deleted

## 2012-05-23 ENCOUNTER — Telehealth: Payer: Self-pay

## 2012-05-23 DIAGNOSIS — D509 Iron deficiency anemia, unspecified: Secondary | ICD-10-CM

## 2012-05-23 NOTE — Telephone Encounter (Signed)
Ok Thx 

## 2012-05-23 NOTE — Telephone Encounter (Signed)
Orders placed in Epic and patient notitified/[facility]

## 2012-05-23 NOTE — Telephone Encounter (Signed)
Patient called [facility] stating that she will have surgery next week and would like to have her hemoglobin rechecked prior to that. Patient last seen 05/08/12, last hemoglobin resulted low on 03/07/12. Please advise Thanks

## 2012-05-27 ENCOUNTER — Other Ambulatory Visit (INDEPENDENT_AMBULATORY_CARE_PROVIDER_SITE_OTHER): Payer: 59

## 2012-05-27 DIAGNOSIS — D509 Iron deficiency anemia, unspecified: Secondary | ICD-10-CM

## 2012-05-27 LAB — IBC PANEL
Saturation Ratios: 11.1 % — ABNORMAL LOW (ref 20.0–50.0)
Transferrin: 263 mg/dL (ref 212.0–360.0)

## 2012-05-28 ENCOUNTER — Encounter (HOSPITAL_BASED_OUTPATIENT_CLINIC_OR_DEPARTMENT_OTHER): Payer: Self-pay | Admitting: Certified Registered"

## 2012-05-28 ENCOUNTER — Encounter (HOSPITAL_BASED_OUTPATIENT_CLINIC_OR_DEPARTMENT_OTHER): Payer: Self-pay | Admitting: *Deleted

## 2012-05-28 ENCOUNTER — Ambulatory Visit (HOSPITAL_BASED_OUTPATIENT_CLINIC_OR_DEPARTMENT_OTHER)
Admission: RE | Admit: 2012-05-28 | Discharge: 2012-05-28 | Disposition: A | Discharge status: Home / Self Care | Payer: 59 | Source: Ambulatory Visit | Attending: Orthopedic Surgery | Admitting: Orthopedic Surgery

## 2012-05-28 ENCOUNTER — Ambulatory Visit (HOSPITAL_BASED_OUTPATIENT_CLINIC_OR_DEPARTMENT_OTHER): Payer: 59 | Admitting: Certified Registered"

## 2012-05-28 ENCOUNTER — Encounter (HOSPITAL_BASED_OUTPATIENT_CLINIC_OR_DEPARTMENT_OTHER)
Admission: RE | Disposition: A | Discharge status: Home / Self Care | Payer: Self-pay | Source: Ambulatory Visit | Attending: Orthopedic Surgery

## 2012-05-28 DIAGNOSIS — IMO0002 Reserved for concepts with insufficient information to code with codable children: Secondary | ICD-10-CM | POA: Insufficient documentation

## 2012-05-28 DIAGNOSIS — M069 Rheumatoid arthritis, unspecified: Secondary | ICD-10-CM | POA: Insufficient documentation

## 2012-05-28 HISTORY — PX: WRIST FUSION: SHX839

## 2012-05-28 HISTORY — DX: Other specified postprocedural states: Z98.890

## 2012-05-28 HISTORY — DX: Other specified postprocedural states: R11.2

## 2012-05-28 LAB — POCT HEMOGLOBIN-HEMACUE: Hemoglobin: 10 g/dL — ABNORMAL LOW (ref 12.0–15.0)

## 2012-05-28 SURGERY — FUSION, JOINT, WRIST
Anesthesia: General | Site: Wrist | Laterality: Right | Wound class: Clean

## 2012-05-28 MED ORDER — ONDANSETRON HCL 4 MG/2ML IJ SOLN
INTRAMUSCULAR | Status: DC | PRN
  Administered 2012-05-28: 4 mg via INTRAVENOUS

## 2012-05-28 MED ORDER — LIDOCAINE HCL (CARDIAC) 20 MG/ML IV SOLN
INTRAVENOUS | Status: DC | PRN
  Administered 2012-05-28: 100 mg via INTRAVENOUS

## 2012-05-28 MED ORDER — LACTATED RINGERS IV SOLN
INTRAVENOUS | Status: DC
  Administered 2012-05-28 (×2): via INTRAVENOUS

## 2012-05-28 MED ORDER — MIDAZOLAM HCL 2 MG/2ML IJ SOLN
1.0000 mg | INTRAMUSCULAR | Status: DC | PRN
  Administered 2012-05-28: 2 mg via INTRAVENOUS

## 2012-05-28 MED ORDER — ACETAMINOPHEN 10 MG/ML IV SOLN: 1000.0000 mg | Freq: Four times a day (QID) | INTRAVENOUS | Status: DC

## 2012-05-28 MED ORDER — ACETAMINOPHEN 10 MG/ML IV SOLN
1000.0000 mg | Freq: Once | INTRAVENOUS | Status: AC
  Administered 2012-05-28: 1000 mg via INTRAVENOUS

## 2012-05-28 MED ORDER — MEPERIDINE HCL 25 MG/ML IJ SOLN: 6.2500 mg | INTRAMUSCULAR | Status: DC | PRN

## 2012-05-28 MED ORDER — OXYCODONE-ACETAMINOPHEN 5-325 MG PO TABS: 1.0000 | ORAL_TABLET | ORAL | Status: AC | PRN

## 2012-05-28 MED ORDER — CHLORHEXIDINE GLUCONATE 4 % EX LIQD: 60.0000 mL | Freq: Once | CUTANEOUS | Status: DC

## 2012-05-28 MED ORDER — PROMETHAZINE HCL 25 MG/ML IJ SOLN: 6.2500 mg | INTRAMUSCULAR | Status: DC | PRN

## 2012-05-28 MED ORDER — FENTANYL CITRATE 0.05 MG/ML IJ SOLN
50.0000 ug | INTRAMUSCULAR | Status: DC | PRN
  Administered 2012-05-28: 100 ug via INTRAVENOUS

## 2012-05-28 MED ORDER — CEFAZOLIN SODIUM-DEXTROSE 2-3 GM-% IV SOLR
2.0000 g | INTRAVENOUS | Status: AC
  Administered 2012-05-28: 2 g via INTRAVENOUS

## 2012-05-28 MED ORDER — PROPOFOL 10 MG/ML IV BOLUS
INTRAVENOUS | Status: DC | PRN
  Administered 2012-05-28: 160 mg via INTRAVENOUS

## 2012-05-28 MED ORDER — DEXAMETHASONE SODIUM PHOSPHATE 4 MG/ML IJ SOLN
INTRAMUSCULAR | Status: DC | PRN
  Administered 2012-05-28: 8 mg via INTRAVENOUS

## 2012-05-28 MED ORDER — HYDROMORPHONE HCL PF 1 MG/ML IJ SOLN: 0.2500 mg | INTRAMUSCULAR | Status: DC | PRN

## 2012-05-28 SURGICAL SUPPLY — 93 items
APL SKNCLS STERI-STRIP NONHPOA (GAUZE/BANDAGES/DRESSINGS) ×1
BAG DECANTER FOR FLEXI CONT (MISCELLANEOUS) IMPLANT
BANDAGE ELASTIC 4 VELCRO ST LF (GAUZE/BANDAGES/DRESSINGS) ×2 IMPLANT
BANDAGE GAUZE ELAST BULKY 4 IN (GAUZE/BANDAGES/DRESSINGS) ×2 IMPLANT
BENZOIN TINCTURE PRP APPL 2/3 (GAUZE/BANDAGES/DRESSINGS) ×2 IMPLANT
BIT DRILL CALIBRATED 1.8MM (BIT) ×1 IMPLANT
BLADE AVERAGE 25X9 (BLADE) IMPLANT
BLADE MINI RND TIP GREEN BEAV (BLADE) ×2 IMPLANT
BLADE SURG 15 STRL LF DISP TIS (BLADE) ×1 IMPLANT
BLADE SURG 15 STRL SS (BLADE) ×2
BNDG CMPR 9X4 STRL LF SNTH (GAUZE/BANDAGES/DRESSINGS) ×1
BNDG ESMARK 4X9 LF (GAUZE/BANDAGES/DRESSINGS) ×2 IMPLANT
BUR EGG 3PK/BX (BURR) IMPLANT
CANISTER SUCTION 1200CC (MISCELLANEOUS) IMPLANT
CLEANER CAUTERY TIP 5X5 PAD (MISCELLANEOUS) IMPLANT
CLOTH BEACON ORANGE TIMEOUT ST (SAFETY) ×2 IMPLANT
CORDS BIPOLAR (ELECTRODE) ×2 IMPLANT
COVER TABLE BACK 60X90 (DRAPES) ×2 IMPLANT
CUFF TOURNIQUET SINGLE 18IN (TOURNIQUET CUFF) ×2 IMPLANT
DECANTER SPIKE VIAL GLASS SM (MISCELLANEOUS) IMPLANT
DRAIN TLS ROUND 10FR (DRAIN) IMPLANT
DRAPE EXTREMITY T 121X128X90 (DRAPE) ×2 IMPLANT
DRAPE INCISE IOBAN 66X45 STRL (DRAPES) IMPLANT
DRAPE OEC MINIVIEW 54X84 (DRAPES) ×2 IMPLANT
DRAPE SURG 17X23 STRL (DRAPES) ×2 IMPLANT
DRILL CALIBRATED 1.8MM (BIT) ×2
DRSG TEGADERM 4X4.75 (GAUZE/BANDAGES/DRESSINGS) IMPLANT
DURAPREP 26ML APPLICATOR (WOUND CARE) ×2 IMPLANT
ELECT REM PT RETURN 9FT ADLT (ELECTROSURGICAL)
ELECTRODE REM PT RTRN 9FT ADLT (ELECTROSURGICAL) IMPLANT
GAUZE SPONGE 4X4 16PLY XRAY LF (GAUZE/BANDAGES/DRESSINGS) IMPLANT
GAUZE XEROFORM 1X8 LF (GAUZE/BANDAGES/DRESSINGS) IMPLANT
GLOVE BIO SURGEON STRL SZ 6.5 (GLOVE) ×2 IMPLANT
GLOVE BIO SURGEON STRL SZ8.5 (GLOVE) ×2 IMPLANT
GLOVE BIOGEL PI IND STRL 7.0 (GLOVE) ×1 IMPLANT
GLOVE BIOGEL PI IND STRL 8.5 (GLOVE) IMPLANT
GLOVE BIOGEL PI INDICATOR 7.0 (GLOVE) ×1
GLOVE BIOGEL PI INDICATOR 8.5 (GLOVE)
GLOVE SURG ORTHO 8.0 STRL STRW (GLOVE) IMPLANT
GOWN PREVENTION PLUS XLARGE (GOWN DISPOSABLE) ×2 IMPLANT
GOWN PREVENTION PLUS XXLARGE (GOWN DISPOSABLE) ×2 IMPLANT
IMPLANT OP-1 (Orthopedic Implant) ×2 IMPLANT
KWIRE 4.0 X .045IN (WIRE) IMPLANT
LOOP VESSEL MAXI BLUE (MISCELLANEOUS) IMPLANT
MASK DUCK BILL (MISCELLANEOUS) ×1
MASK SURG DUCK BILL (MISCELLANEOUS) ×1 IMPLANT
NEEDLE HYPO 25X1 1.5 SAFETY (NEEDLE) IMPLANT
NS IRRIG 1000ML POUR BTL (IV SOLUTION) ×2 IMPLANT
PACK BASIN DAY SURGERY FS (CUSTOM PROCEDURE TRAY) ×2 IMPLANT
PAD CAST 3X4 CTTN HI CHSV (CAST SUPPLIES) ×2 IMPLANT
PAD CLEANER CAUTERY TIP 5X5 (MISCELLANEOUS)
PADDING CAST ABS 4INX4YD NS (CAST SUPPLIES) ×1
PADDING CAST ABS COTTON 4X4 ST (CAST SUPPLIES) ×1 IMPLANT
PADDING CAST COTTON 3X4 STRL (CAST SUPPLIES) ×4
PENCIL BUTTON HOLSTER BLD 10FT (ELECTRODE) IMPLANT
PLATE LCP 7H 60MM 2.4MM (Plate) ×2 IMPLANT
PLATE LCP 8H 68MM 2.4MM (Plate) ×2 IMPLANT
SCREW CORTEX 2.4X16MM (Screw) ×2 IMPLANT
SCREW CORTEX 2.4X22MM (Screw) ×4 IMPLANT
SCREW CORTEX SLFTPNG 24MM 2.4 (Screw) ×2 IMPLANT
SCREW CORTEX SLFTPNG 26MM 2.4 (Screw) ×2 IMPLANT
SHEET MEDIUM DRAPE 40X70 STRL (DRAPES) ×2 IMPLANT
SPLINT PLASTER CAST XFAST 4X15 (CAST SUPPLIES) ×15 IMPLANT
SPLINT PLASTER XTRA FAST SET 4 (CAST SUPPLIES) ×15
SPONGE GAUZE 4X4 12PLY (GAUZE/BANDAGES/DRESSINGS) ×2 IMPLANT
SPONGE LAP 4X18 X RAY DECT (DISPOSABLE) IMPLANT
STOCKINETTE 4X48 STRL (DRAPES) ×2 IMPLANT
STRIP CLOSURE SKIN 1/2X4 (GAUZE/BANDAGES/DRESSINGS) ×2 IMPLANT
SUCTION FRAZIER TIP 10 FR DISP (SUCTIONS) IMPLANT
SUT ETHIBOND 3-0 V-5 (SUTURE) IMPLANT
SUT ETHILON 4 0 PS 2 18 (SUTURE) IMPLANT
SUT MNCRL AB 3-0 PS2 18 (SUTURE) IMPLANT
SUT MON AB 4-0 PC3 18 (SUTURE) IMPLANT
SUT PROLENE 3 0 PS 2 (SUTURE) IMPLANT
SUT VIC AB 0 CT1 27 (SUTURE)
SUT VIC AB 0 CT1 27XBRD ANBCTR (SUTURE) IMPLANT
SUT VIC AB 0 SH 27 (SUTURE) IMPLANT
SUT VIC AB 2-0 CT1 27 (SUTURE)
SUT VIC AB 2-0 CT1 TAPERPNT 27 (SUTURE) IMPLANT
SUT VIC AB 2-0 PS2 27 (SUTURE) ×2 IMPLANT
SUT VIC AB 2-0 SH 27 (SUTURE)
SUT VIC AB 2-0 SH 27XBRD (SUTURE) IMPLANT
SUT VIC AB 3-0 PS1 18 (SUTURE)
SUT VIC AB 3-0 PS1 18XBRD (SUTURE) IMPLANT
SUT VICRYL 4-0 PS2 18IN ABS (SUTURE) IMPLANT
SUT VICRYL AB 3 0 TIES (SUTURE) IMPLANT
SUT VICRYL RAPIDE 4/0 PS 2 (SUTURE) ×2 IMPLANT
SYR BULB 3OZ (MISCELLANEOUS) ×2 IMPLANT
SYRINGE 10CC LL (SYRINGE) IMPLANT
TOWEL OR 17X24 6PK STRL BLUE (TOWEL DISPOSABLE) ×4 IMPLANT
TUBE CONNECTING 20X1/4 (TUBING) IMPLANT
UNDERPAD 30X30 INCONTINENT (UNDERPADS AND DIAPERS) ×2 IMPLANT
WATER STERILE IRR 1000ML POUR (IV SOLUTION) ×2 IMPLANT

## 2012-05-28 NOTE — Anesthesia Preprocedure Evaluation (Signed)
Anesthesia Evaluation  Patient identified by MRN, date of birth, ID band Patient awake    History of Anesthesia Complications (+) PONV  Airway Mallampati: I  Neck ROM: Full    Dental  (+) Teeth Intact   Pulmonary asthma ,  breath sounds clear to auscultation        Cardiovascular negative cardio ROS  Rhythm:Regular Rate:Normal     Neuro/Psych    GI/Hepatic negative GI ROS, Neg liver ROS,   Endo/Other  negative endocrine ROS  Renal/GU negative Renal ROS     Musculoskeletal  (+) Arthritis -,   Abdominal   Peds  Hematology negative hematology ROS (+)   Anesthesia Other Findings   Reproductive/Obstetrics                           Anesthesia Physical Anesthesia Plan  ASA: I  Anesthesia Plan:    Post-op Pain Management:    Induction: Intravenous  Airway Management Planned: LMA  Additional Equipment:   Intra-op Plan:   Post-operative Plan: Extubation in OR  Informed Consent: I have reviewed the patients History and Physical, chart, labs and discussed the procedure including the risks, benefits and alternatives for the proposed anesthesia with the patient or authorized representative who has indicated his/her understanding and acceptance.   Dental advisory given  Plan Discussed with: CRNA and Surgeon  Anesthesia Plan Comments:         Anesthesia Quick Evaluation

## 2012-05-28 NOTE — Brief Op Note (Signed)
05/28/2012  11:39 AM  PATIENT:  Miranda [name]  26 y.o. female  PRE-OPERATIVE DIAGNOSIS:  right wrist rheumatoid arthritis  POST-OPERATIVE DIAGNOSIS:  same as preop  PROCEDURE:  Procedure(s) (LRB) with comments: ARTHRODESIS WRIST (Right) - Right wrist fusion  SURGEON:  Surgeon(s) and Role:    * Marlowe Shores, MD - Primary  PHYSICIAN ASSISTANT:   ASSISTANTS: none   ANESTHESIA:   general  EBL:  Total I/O In: 1000 [I.V.:1000] Out: -   BLOOD ADMINISTERED:none  DRAINS: none   LOCAL MEDICATIONS USED:  NONE  SPECIMEN:  Excision  DISPOSITION OF SPECIMEN:  PATHOLOGY  COUNTS:  YES  TOURNIQUET:   Total Tourniquet Time Documented: Upper Arm (Right) - 83 minutes  DICTATION: .Other Dictation: Dictation Number [id number]  PLAN OF CARE: Discharge to home after PACU  PATIENT DISPOSITION:  PACU - hemodynamically stable.   Delay start of Pharmacological VTE agent (>24hrs) due to surgical blood loss or risk of bleeding: not applicable

## 2012-05-28 NOTE — Anesthesia Procedure Notes (Addendum)
Anesthesia Regional Block:  Supraclavicular block  Pre-Anesthetic Checklist: ,, timeout performed, Correct Patient, Correct Site, Correct Laterality, Correct Procedure, Correct Position, site marked, Risks and benefits discussed, Surgical consent,  Pre-op evaluation,  At surgeon's request  Laterality: Right and Upper  Prep: chloraprep       Needles:   Needle Type: Echogenic Needle      Needle Gauge: 22 and 22 G  Needle insertion depth: 4 cm   Additional Needles:  Procedures: ultrasound guided Supraclavicular block Narrative:  Start time: 05/28/2012 9:20 AM End time: 05/28/2012 9:35 AM Injection made incrementally with aspirations every 5 mL.  Performed by: Personally  Anesthesiologist: T Massagee  Additional Notes: Tolerated Well . No problems  Supraclavicular block Procedure Name: LMA Insertion Date/Time: 05/28/2012 9:55 AM Performed by: Verlan Friends Pre-anesthesia Checklist: Patient identified, Emergency Drugs available, Suction available, Patient being monitored and Timeout performed Patient Re-evaluated:Patient Re-evaluated prior to inductionOxygen Delivery Method: Circle System Utilized Preoxygenation: Pre-oxygenation with 100% oxygen Intubation Type: IV induction Ventilation: Mask ventilation without difficulty LMA: LMA inserted LMA Size: 4.0 Number of attempts: 1 Airway Equipment and Method: bite block Placement Confirmation: positive ETCO2 Tube secured with: Tape Dental Injury: Teeth and Oropharynx as per pre-operative assessment

## 2012-05-28 NOTE — H&P (Signed)
Miranda [name] is an 26 y.o. female.   Chief Complaint: right wrist pain HPI: as above with RA and wrist pain and decreased ROM  Past Medical History  Diagnosis Date  . ANEMIA-NOS 2005    Baseline Hgb has been in 9-10 range.   Marland Kitchen PONV (postoperative nausea and vomiting)   . Rheumatoid arthritis     only on prednisone and celebrex    Past Surgical History  Procedure Date  . Wisdom tooth extraction   . Laparoscopy 03/26/2012    Procedure: LAPAROSCOPY OPERATIVE;  Surgeon: Ok Edwards, MD;  Location: [facility];  Service: Gynecology;  Laterality: N/A;  Pelvic Washings  . Ovarian cyst removal 03/26/2012    Procedure: OVARIAN CYSTECTOMY;  Surgeon: Ok Edwards, MD;  Location: [facility];  Service: Gynecology;  Laterality: Bilateral;  . Dilation and curettage of uterus     Family History  Problem Relation Age of Onset  . Diabetes Other   . Diabetes Father    Social History:  reports that she has never smoked. She has never used smokeless tobacco. She reports that she does not drink alcohol or use illicit drugs.  Allergies:  Allergies  Allergen Reactions  . Milk-Related Compounds Itching    Medications Prior to Admission  Medication Sig Dispense Refill  . calcium-vitamin D (OSCAL WITH D) 250-125 MG-UNIT per tablet Take 1 tablet by mouth daily.        . celecoxib (CELEBREX) 200 MG capsule Take 200 mg by mouth 2 (two) times daily.      Marland Kitchen Fe Cbn-Fe Gluc-FA-B12-C-DSS (FERRALET 90) 90-1 MG TABS Take 1 tablet by mouth daily.  30 each  11  . Multiple Vitamins-Minerals (MULTIVITAMIN WITH MINERALS) tablet Take 1 tablet by mouth daily.        . Norgestimate-Ethinyl Estradiol Triphasic (ORTHO TRI-CYCLEN LO) 0.18/0.215/0.25 MG-25 MCG tablet Take 1 tablet by mouth daily.  1 Package  11  . prednisoLONE 5 MG TABS Take by mouth.      . GNP SUPER THIN LANCETS 30G MISC 30 g by Does not apply route 2 (two) times daily.  100 each  0  . metoCLOPramide (REGLAN) 10 MG tablet Take 1 tablet (10 mg total)  by mouth 3 (three) times daily with meals.  30 tablet  1    Results for orders placed in visit on 05/27/12 (from the past 48 hour(s))  IBC PANEL     Status: Abnormal   Collection Time   05/27/12  3:20 PM      Component Value Range Comment   Iron 41 (*) 42 - 145 ug/dL    Transferrin 409.8  119.1 - 360.0 mg/dL    Saturation Ratios 47.8 (*) 20.0 - 50.0 %    No results found.  Review of Systems  All other systems reviewed and are negative.    Blood pressure 127/80, pulse 122, temperature 98.7 F (37.1 C), temperature source Oral, resp. rate 17, height 5\' 4"  (1.626 m), weight 61.689 kg (136 lb), last menstrual period 05/11/2012, SpO2 100.00%. Physical Exam  Constitutional: She is oriented to person, place, and time. She appears well-developed and well-nourished.  HENT:  Head: Normocephalic and atraumatic.  Cardiovascular: Normal rate.   Respiratory: Effort normal.  Musculoskeletal:       Right wrist: She exhibits decreased range of motion, tenderness and crepitus.  Neurological: She is alert and oriented to person, place, and time.  Skin: Skin is warm.  Psychiatric: She has a normal mood and affect. Her  behavior is normal. Judgment and thought content normal.     Assessment/Plan As above   Plan wrist arthrodesis with OP-1 graft  [place] Hijazi A 05/28/2012, 9:43 AM

## 2012-05-28 NOTE — Progress Notes (Signed)
Assisted Dr. Massagee with right, ultrasound guided, supraclavicular block. Side rails up, monitors on throughout procedure. See vital signs in flow sheet. Tolerated Procedure well. 

## 2012-05-28 NOTE — Op Note (Signed)
See dictated note [phone]

## 2012-05-28 NOTE — Transfer of Care (Signed)
Immediate Anesthesia Transfer of Care Note  Patient: Miranda [name]  Procedure(s) Performed: Procedure(s) (LRB) with comments: ARTHRODESIS WRIST (Right) - Right wrist fusion  Patient Location: PACU  Anesthesia Type: GA combined with regional for post-op pain  Level of Consciousness: awake, alert , oriented and patient cooperative  Airway & Oxygen Therapy: Patient Spontanous Breathing and Patient connected to face mask oxygen  Post-op Assessment: Report given to PACU RN and Post -op Vital signs reviewed and stable  Post vital signs: Reviewed and stable  Complications: No apparent anesthesia complications

## 2012-05-29 ENCOUNTER — Encounter (HOSPITAL_BASED_OUTPATIENT_CLINIC_OR_DEPARTMENT_OTHER): Payer: Self-pay | Admitting: Orthopedic Surgery

## 2012-05-29 NOTE — Op Note (Signed)
NAMEMAGALI, [name] NO.:  [id number]  MEDICAL RECORD NO.:  [id number]  LOCATION:                                 FACILITY:  PHYSICIAN:  Artist Pais. Alin Chavira, M.D.DATE OF BIRTH:  May 31, 1986  DATE OF PROCEDURE:  05/28/2012 DATE OF DISCHARGE:                              OPERATIVE REPORT   PREOPERATIVE DIAGNOSIS:  Severe rheumatoid arthritis, right wrist.  POSTOPERATIVE DIAGNOSIS:  Severe rheumatoid arthritis, right wrist.  PROCEDURE:  Right wrist arthrodesis using OP-1 grafting as well as two 2.4-mm Synthes modular handset dorsal plates, one 8 hole and one 7 hole, as well as EPL synovectomy and posterior interosseous nerve neurectomy.  SURGEON:  Artist Pais. Mina Marble, MD  ASSISTANT:  None.  ANESTHESIA:  Axillary block and general.  No complication.  No drains.  The patient was taken to the operating suite.  After induction of axillary block analgesia and then laryngeal mask airway general anesthetic, the right upper extremity was prepped and draped in usual sterile fashion.  An Esmarch was used to exsanguinate the limb. Tourniquet was inflated to 250 mmHg.  At this point in time, a midline incision was made over the radiocarpal joint, right wrist.  Skin was incised.  The interval between the fourth and second dorsal compartments was identified.  The fourth dorsal compartment was incised.  The EDC tendons were carefully retracted at the ulnar side.  The APL was identified with significant synovitis about the EPL tendon, it was removed from its fibro-osseous sheath and a complete synovectomy was performed.  The material was sent for pathologic diagnosis/confirmation. The EPL was then retracted to the radial side and the second dorsal compartment was elevated off the carpal bones.  Next, a midline capsulotomy was performed.  Flaps were raised revealing the radiocarpal joint.  There was an autofusion of the mid carpal joint, which was solid.  Dissection was  carried out proximally and distally into the radiocarpal joint was clearly identified.  There was significant degenerative changes at the radiolunate and radioscaphoid joints.  At this point in time, the posterior interosseous nerve was identified and resected.  The remaining cartilage on the radius and the scaphoid and lunate were carefully decorticated using rongeurs and curettes.  Once this was done, intraoperative fluoroscopy was used to determine adequate fusion position.  An 8-hole 2.4-mm stainless steel compression plate was placed along the radial side of the distal radius out onto the area between the index and long metacarpals and fluoroscopic guidance was placed with the cortical screw proximally and distally for provisional fixation.  This was deemed to be in good position.  The remaining screws were placed.  At this point in time, the OP-1 graft was packed into the fusion site, and after this was done, a 7-hole plate was placed just ulnar to the first plate.  Intraoperative fluoroscopy revealed adequate reduction in AP, lateral, and oblique view, good placement of the graft.  The wound was gently irrigated.  The capsule was closed with 2-0 undyed Vicryl and the skin with a 4-0 Vicryl Rapide subcuticular stitch.  Steri-Strips, 4x4s, fluffs, and a volar splint was applied.  The patient tolerated the all procedures well, went to the  recovery room in stable fashion.     Artist Pais Mina Marble, M.D.     MAW/MEDQ  D:  05/28/2012  T:  05/29/2012  Job:  [id number]

## 2012-06-01 NOTE — Progress Notes (Signed)
No show.  Reschedule prn.  

## 2012-06-10 NOTE — Anesthesia Postprocedure Evaluation (Signed)
  Anesthesia Post-op Note  Patient: Miranda [name]  Procedure(s) Performed: Procedure(s) (LRB) with comments: ARTHRODESIS WRIST (Right) - Right wrist fusion  Patient Location: PACU  Anesthesia Type: General  Level of Consciousness: awake  Airway and Oxygen Therapy: Patient Spontanous Breathing  Post-op Pain: mild  Post-op Assessment: Post-op Vital signs reviewed  Post-op Vital Signs: stable  Complications: No apparent anesthesia complications

## 2012-06-12 ENCOUNTER — Encounter: Payer: Self-pay | Admitting: Pharmacist

## 2012-06-12 ENCOUNTER — Ambulatory Visit (INDEPENDENT_AMBULATORY_CARE_PROVIDER_SITE_OTHER): Payer: 59 | Admitting: Pharmacist

## 2012-06-12 VITALS — BP 138/90 | HR 94 | Ht 64.0 in | Wt 135.2 lb

## 2012-06-12 DIAGNOSIS — M069 Rheumatoid arthritis, unspecified: Secondary | ICD-10-CM

## 2012-06-12 NOTE — Progress Notes (Signed)
Patient ID: Miranda [name], female   DOB: 10-23-1985, 26 y.o.   MRN: [id number] Reviewed and agree with Dr. Macky Lower management and documentation

## 2012-06-12 NOTE — Progress Notes (Signed)
  Subjective:    Patient ID: [place], female    DOB: [birth year], 26 y.o.   MRN: [id number]  HPI Patient arrives in good spirits for medication review.   Reports seeing Dr. Sanda Linger at [facility] as primary care provider, Dr. Pollyann Savoy as rheumatologist, and Dr. Lily Peer at [facility].  Reports being diagnosed with Rheumatoid Arthritis since  2007 and states this is currently not controlled. She has had two surgeries this year related to her rheumatoid arthritis.      Review of Systems     Objective:   Physical Exam        Assessment & Plan:  Patient has been diagnosed 6 yrs ago with rheumatoid arthritis and has reported use of multiple ineffective therapies.  She plans to initiate Remicade in the near future. Following medication review, we provided the option of considering Mobic as an alternative to Celebrex/ibuprofen as patient states Celebrex is ineffective and finds it difficult to take ibuprofen TID.  Complete medication list provided to patient.  Total time in face to face medication review: 30 minutes.  Patient seen with: Allene Dillon, PharmD Candidate.

## 2012-06-12 NOTE — Patient Instructions (Addendum)
If Celebrex or ibuprofen isn't helping with the pain, you could try meloxicam (Mobic) which is available as a once daily drug and is relatively inexpensive.

## 2012-06-12 NOTE — Assessment & Plan Note (Signed)
Patient has been diagnosed 6 yrs ago with rheumatoid arthritis and has reported use of multiple ineffective therapies.  She plans to initiate Remicade in the near future. Following medication review, we provided the option of considering Mobic as an alternative to Celebrex/ibuprofen as patient states Celebrex is ineffective and finds it difficult to take ibuprofen TID.  Complete medication list provided to patient.  Total time in face to face medication review: 30 minutes.  Patient seen with: Allene Dillon, PharmD Candidate.

## 2012-06-25 ENCOUNTER — Other Ambulatory Visit (HOSPITAL_BASED_OUTPATIENT_CLINIC_OR_DEPARTMENT_OTHER): Payer: 59 | Admitting: Lab

## 2012-06-25 DIAGNOSIS — M069 Rheumatoid arthritis, unspecified: Secondary | ICD-10-CM

## 2012-06-25 DIAGNOSIS — D509 Iron deficiency anemia, unspecified: Secondary | ICD-10-CM

## 2012-06-25 LAB — CBC WITH DIFFERENTIAL/PLATELET
Basophils Absolute: 0 10*3/uL (ref 0.0–0.1)
EOS%: 1.7 % (ref 0.0–7.0)
Eosinophils Absolute: 0.2 10*3/uL (ref 0.0–0.5)
HCT: 33.1 % — ABNORMAL LOW (ref 34.8–46.6)
HGB: 10.7 g/dL — ABNORMAL LOW (ref 11.6–15.9)
MCH: 27.2 pg (ref 25.1–34.0)
MONO#: 0.7 10*3/uL (ref 0.1–0.9)
NEUT#: 4 10*3/uL (ref 1.5–6.5)
NEUT%: 45.8 % (ref 38.4–76.8)
lymph#: 3.9 10*3/uL — ABNORMAL HIGH (ref 0.9–3.3)

## 2012-06-26 ENCOUNTER — Telehealth: Payer: Self-pay

## 2012-06-26 NOTE — Telephone Encounter (Signed)
Message copied by Kallie Locks on Thu Jun 26, 2012  4:47 PM ------      Message from: HA, Raliegh Ip T      Created: Thu Jun 26, 2012 10:09 AM       Please call pt.  Her Hgb continues to improve.  If she tolerates oral iron, continue as she has been doing.  If she cannot tolerate oral iron, we will continue to monitor and give iv iron as needed.  I still prefer oral iron if possible since it's cheaper and no change of infusion reaction.  Thanks.

## 2012-07-01 ENCOUNTER — Other Ambulatory Visit (HOSPITAL_COMMUNITY): Payer: Self-pay | Admitting: *Deleted

## 2012-07-03 ENCOUNTER — Encounter (HOSPITAL_COMMUNITY): Payer: Self-pay

## 2012-07-03 ENCOUNTER — Encounter (HOSPITAL_COMMUNITY)
Admission: RE | Admit: 2012-07-03 | Discharge: 2012-07-03 | Disposition: A | Discharge status: Home / Self Care | Payer: 59 | Source: Ambulatory Visit | Attending: Rheumatology | Admitting: Rheumatology

## 2012-07-03 DIAGNOSIS — M069 Rheumatoid arthritis, unspecified: Secondary | ICD-10-CM | POA: Insufficient documentation

## 2012-07-03 MED ORDER — SODIUM CHLORIDE 0.9 % IV SOLN
INTRAVENOUS | Status: AC
  Administered 2012-07-03: 09:00:00 via INTRAVENOUS

## 2012-07-03 MED ORDER — ACETAMINOPHEN 325 MG PO TABS
650.0000 mg | ORAL_TABLET | ORAL | Status: AC
  Administered 2012-07-03: 650 mg via ORAL
  Filled 2012-07-03: qty 2

## 2012-07-03 MED ORDER — SODIUM CHLORIDE 0.9 % IV SOLN
3.0000 mg/kg | INTRAVENOUS | Status: DC
  Administered 2012-07-03: 200 mg via INTRAVENOUS
  Filled 2012-07-03: qty 20

## 2012-07-17 ENCOUNTER — Encounter (HOSPITAL_COMMUNITY): Payer: Self-pay

## 2012-07-17 ENCOUNTER — Encounter (HOSPITAL_COMMUNITY)
Admission: RE | Admit: 2012-07-17 | Discharge: 2012-07-17 | Disposition: A | Discharge status: Home / Self Care | Payer: 59 | Source: Ambulatory Visit | Attending: Rheumatology | Admitting: Rheumatology

## 2012-07-17 MED ORDER — SODIUM CHLORIDE 0.9 % IV SOLN
INTRAVENOUS | Status: DC
  Administered 2012-07-17: 09:00:00 via INTRAVENOUS

## 2012-07-17 MED ORDER — ACETAMINOPHEN 325 MG PO TABS
650.0000 mg | ORAL_TABLET | ORAL | Status: DC
  Administered 2012-07-17: 650 mg via ORAL
  Filled 2012-07-17: qty 2

## 2012-07-17 MED ORDER — SODIUM CHLORIDE 0.9 % IV SOLN
3.0000 mg/kg | Freq: Once | INTRAVENOUS | Status: AC
  Administered 2012-07-17: 200 mg via INTRAVENOUS
  Filled 2012-07-17: qty 20

## 2012-07-17 NOTE — Progress Notes (Signed)
Pt arrived today for week #2 REMICADE she has a fine rash in sternal notch that is slightly "itchy" states it started "last week and is similar to the type of rash she gets on the back of her neck when exposed to milk" no other c/o of rash or itching anywhere else

## 2012-08-08 ENCOUNTER — Other Ambulatory Visit (HOSPITAL_COMMUNITY): Payer: Self-pay | Admitting: Rheumatology

## 2012-08-08 DIAGNOSIS — M8580 Other specified disorders of bone density and structure, unspecified site: Secondary | ICD-10-CM

## 2012-08-11 ENCOUNTER — Ambulatory Visit (HOSPITAL_COMMUNITY): Payer: 59

## 2012-08-14 ENCOUNTER — Encounter (HOSPITAL_COMMUNITY)
Admission: RE | Admit: 2012-08-14 | Discharge: 2012-08-14 | Disposition: A | Discharge status: Home / Self Care | Payer: 59 | Source: Ambulatory Visit | Attending: Rheumatology | Admitting: Rheumatology

## 2012-08-14 DIAGNOSIS — M069 Rheumatoid arthritis, unspecified: Secondary | ICD-10-CM | POA: Insufficient documentation

## 2012-08-14 MED ORDER — ACETAMINOPHEN 325 MG PO TABS
650.0000 mg | ORAL_TABLET | ORAL | Status: DC
  Administered 2012-08-14: 650 mg via ORAL
  Filled 2012-08-14: qty 2

## 2012-08-14 MED ORDER — DIPHENHYDRAMINE HCL 25 MG PO TABS
25.0000 mg | ORAL_TABLET | ORAL | Status: DC | PRN
  Administered 2012-08-14: 25 mg via ORAL
  Filled 2012-08-14 (×2): qty 1

## 2012-08-14 MED ORDER — METHYLPREDNISOLONE SODIUM SUCC 40 MG IJ SOLR
40.0000 mg | INTRAMUSCULAR | Status: DC | PRN
  Administered 2012-08-14: 40 mg via INTRAVENOUS
  Filled 2012-08-14: qty 0.64

## 2012-08-14 MED ORDER — SODIUM CHLORIDE 0.9 % IV SOLN
INTRAVENOUS | Status: DC
  Administered 2012-08-14: 10:00:00 via INTRAVENOUS

## 2012-08-14 MED ORDER — SODIUM CHLORIDE 0.9 % IV SOLN
3.0000 mg/kg | Freq: Once | INTRAVENOUS | Status: AC
  Administered 2012-08-14: 200 mg via INTRAVENOUS
  Filled 2012-08-14: qty 20

## 2012-08-14 NOTE — Progress Notes (Addendum)
Call received back from dr Cira Rue nurse Misty Stanley. Pt is to receive REMICADE 3mg /kg as ordered today and to receive additional premeds of Solumedrol and benadryl (in addition to tylenol) and next infusion is to be in 8 weeks as per orders

## 2012-08-14 NOTE — Progress Notes (Addendum)
Arrives today for week #6 of Remicade induction. States she has been itching since week #2 and has discussed this with Dr Fatima Sanger {A. Was told to take Zyrtec and Atarax. Pt states sometimes" the itching is so bad that I have scabs" but the zyrtec and Atarax quiets the itch. Checked upper chest and abdomen where pt indicates" sometimes she has scabs from the scratching". Skin is dry and no visible scabs at this time. States that the itching is "quiet at this time because she took the Zyrtec this morning" Placed call to Dr Fatima Sanger offic and spoke to nurse Misty Stanley to discuss this with her

## 2012-08-25 ENCOUNTER — Telehealth: Payer: Self-pay | Admitting: Oncology

## 2012-08-25 ENCOUNTER — Other Ambulatory Visit (HOSPITAL_BASED_OUTPATIENT_CLINIC_OR_DEPARTMENT_OTHER): Payer: 59 | Admitting: Lab

## 2012-08-25 ENCOUNTER — Encounter: Payer: Self-pay | Admitting: Oncology

## 2012-08-25 ENCOUNTER — Ambulatory Visit (HOSPITAL_BASED_OUTPATIENT_CLINIC_OR_DEPARTMENT_OTHER): Payer: 59 | Admitting: Oncology

## 2012-08-25 VITALS — BP 127/86 | HR 74 | Temp 97.8°F | Resp 20 | Ht 64.0 in | Wt 140.6 lb

## 2012-08-25 DIAGNOSIS — M069 Rheumatoid arthritis, unspecified: Secondary | ICD-10-CM

## 2012-08-25 DIAGNOSIS — D509 Iron deficiency anemia, unspecified: Secondary | ICD-10-CM

## 2012-08-25 LAB — IRON AND TIBC
%SAT: 43 % (ref 20–55)
TIBC: 304 ug/dL (ref 250–470)

## 2012-08-25 LAB — CBC WITH DIFFERENTIAL/PLATELET
Basophils Absolute: 0 10*3/uL (ref 0.0–0.1)
Eosinophils Absolute: 0.1 10*3/uL (ref 0.0–0.5)
HCT: 32.6 % — ABNORMAL LOW (ref 34.8–46.6)
HGB: 10.7 g/dL — ABNORMAL LOW (ref 11.6–15.9)
LYMPH%: 51 % — ABNORMAL HIGH (ref 14.0–49.7)
MCV: 86.3 fL (ref 79.5–101.0)
MONO#: 0.3 10*3/uL (ref 0.1–0.9)
MONO%: 6 % (ref 0.0–14.0)
NEUT#: 2.2 10*3/uL (ref 1.5–6.5)
Platelets: 281 10*3/uL (ref 145–400)
WBC: 5.3 10*3/uL (ref 3.9–10.3)

## 2012-08-25 LAB — COMPREHENSIVE METABOLIC PANEL (CC13)
AST: 18 U/L (ref 5–34)
Albumin: 3.7 g/dL (ref 3.5–5.0)
BUN: 8 mg/dL (ref 7.0–26.0)
Calcium: 9.3 mg/dL (ref 8.4–10.4)
Chloride: 105 mEq/L (ref 98–107)
Glucose: 98 mg/dl (ref 70–99)
Potassium: 3.8 mEq/L (ref 3.5–5.1)
Sodium: 140 mEq/L (ref 136–145)
Total Protein: 8.1 g/dL (ref 6.4–8.3)

## 2012-08-25 NOTE — Telephone Encounter (Signed)
appts made and printed for pt aom °

## 2012-08-25 NOTE — Progress Notes (Signed)
[facility]  Telephone:([phone]) [phone] [phone]) [phone]   OFFICE PROGRESS NOTE   Cc:  Sanda Linger, MD  DIAGNOSIS: Iron deficiency anemia  PAST THERAPY: Feraheme 1,020 mg IV on 02/25/12  CURRENT THERAPY: Ferrous sulfate 650 mg daily.  INTERVAL HISTORY: Miranda [name] 26 y.o. female returns for routine follow-up for her iron deficiency anemia. States she is taking oral iron without difficulty. Still has heavy periods, but now only lasting for 3 days instead of 5. Denies any other bleeding. She continues to have chest pain which has been an ongoing issue for her for over 1 year. No shortness of breath or dyspnea. With regards to her RA, she is now on Remicade. Feels RA is not well controlled at this time.  Past Medical History  Diagnosis Date  . ANEMIA-NOS 2005    Baseline Hgb has been in 9-10 range.   Marland Kitchen PONV (postoperative nausea and vomiting)   . Rheumatoid arthritis     only on prednisone and celebrex  . Chest pain, non-cardiac Feb 2013    " inflammation in chest"    Past Surgical History  Procedure Date  . Wisdom tooth extraction   . Laparoscopy 03/26/2012    Procedure: LAPAROSCOPY OPERATIVE;  Surgeon: Ok Edwards, MD;  Location: [facility];  Service: Gynecology;  Laterality: N/A;  Pelvic Washings  . Ovarian cyst removal 03/26/2012    Procedure: OVARIAN CYSTECTOMY;  Surgeon: Ok Edwards, MD;  Location: [facility];  Service: Gynecology;  Laterality: Bilateral;  . Dilation and curettage of uterus   . Wrist fusion 05/28/2012    Procedure: ARTHRODESIS WRIST;  Surgeon: Marlowe Shores, MD;  Location: [facility];  Service: Orthopedics;  Laterality: Right;  Right wrist fusion    Current Outpatient Prescriptions  Medication Sig Dispense Refill  . ferrous sulfate 325 (65 FE) MG tablet Take 325 mg by mouth daily with breakfast. Take 2 tabs in the morning      . calcium-vitamin D (OSCAL WITH D) 250-125 MG-UNIT per tablet Take 1 tablet by mouth  daily.        . cetirizine (ZYRTEC) 10 MG tablet Take 10 mg by mouth daily.      Marland Kitchen GNP SUPER THIN LANCETS 30G MISC 30 g by Does not apply route 2 (two) times daily.  100 each  0  . hydrOXYzine (ATARAX/VISTARIL) 25 MG tablet Take 25 mg by mouth once.      Marland Kitchen ibuprofen (ADVIL,MOTRIN) 200 MG tablet Take 400-800 mg by mouth every 6 (six) hours as needed.      . inFLIXimab (REMICADE) 100 MG injection Inject 200 mg into the vein every 8 (eight) weeks.       . Multiple Vitamins-Iron (MULTIVITAMINS WITH IRON) TABS Take 2 tablets by mouth daily.      . Norgestimate-Ethinyl Estradiol Triphasic (ORTHO TRI-CYCLEN LO) 0.18/0.215/0.25 MG-25 MCG tablet Take 1 tablet by mouth daily.  1 Package  11  . ranitidine (ZANTAC) 150 MG tablet Take 150 mg by mouth daily.      . vitamin C (ASCORBIC ACID) 500 MG tablet Take 500 mg by mouth daily.        ALLERGIES:  is allergic to milk-related compounds.  REVIEW OF SYSTEMS:  The rest of the 14-point review of system was negative.   Filed Vitals:   08/25/12 0854  BP: 127/86  Pulse: 74  Temp: 97.8 F (36.6 C)  Resp: 20   Wt Readings from Last 3 Encounters:  08/25/12  140 lb 9.6 oz (63.776 kg)  08/14/12 139 lb 2 oz (63.107 kg)  07/17/12 139 lb 8 oz (63.277 kg)   ECOG Performance status: 1 due to RA  PHYSICAL EXAMINATION:   General:  well-nourished in no acute distress.  Eyes:  no scleral icterus.  ENT:  There were no oropharyngeal lesions.  Neck was without thyromegaly.  Lymphatics:  Negative cervical, supraclavicular or axillary adenopathy.  Respiratory: lungs were clear bilaterally without wheezing or crackles.  Cardiovascular:  Regular rate and rhythm, S1/S2, without murmur, rub or gallop.  There was no pedal edema.  GI:  abdomen was soft, flat, nontender, nondistended, without organomegaly.  Muscoloskeletal:  no spinal tenderness of palpation of vertebral spine.  Skin exam was without echymosis, petichae.  Neuro exam was nonfocal.  Patient was able to get on and  off exam table without assistance.  Gait was normal.  Patient was alerted and oriented.  Attention was good.   Language was appropriate.  Mood was normal without depression.  Speech was not pressured.  Thought content was not tangential.    LABORATORY/RADIOLOGY DATA:  Lab Results  Component Value Date   WBC 5.3 08/25/2012   HGB 10.7* 08/25/2012   HCT 32.6* 08/25/2012   PLT 281 08/25/2012   GLUCOSE 98 08/25/2012   CHOL 193 07/27/2011   TRIG 26.0 07/27/2011   HDL 60.50 07/27/2011   LDLCALC 127* 07/27/2011   ALKPHOS 96 08/25/2012   ALT 10 08/25/2012   AST 18 08/25/2012   NA 140 08/25/2012   K 3.8 08/25/2012   CL 105 08/25/2012   CREATININE 0.7 08/25/2012   BUN 8.0 08/25/2012   CO2 25 08/25/2012   HGBA1C 5.7 01/01/2012    ASSESSMENT AND PLAN:   1. Rheumatoid arthritis: She is on Remicade per Rheumatology. States RA is not controlled. She will discuss this further with Rheumatology.  2. Iron deficiency anemia: New since 2011 making less likely hemoglobinopathy. Her iron panel was mixed with low iron but normal ferritin. With her RA, ferritin may be elevated in the case of iron deficiency. Her iron deficiency is most likely due to menorrhagia. S/P Feraheme and now on oral iron. Hemoglobin has improved to 10.7. Iron studies are pending today. Continue oral iron.  3. Follow up: Lab at the [facility] every 2 months. Return visit in about 6 months.      The length of time of the face-to-face encounter was 15 minutes. More than 50% of time was spent counseling and coordination of care.

## 2012-08-28 ENCOUNTER — Encounter (HOSPITAL_COMMUNITY): Payer: 59

## 2012-09-09 ENCOUNTER — Ambulatory Visit (HOSPITAL_COMMUNITY)
Admission: RE | Admit: 2012-09-09 | Discharge: 2012-09-09 | Disposition: A | Discharge status: Home / Self Care | Payer: 59 | Source: Ambulatory Visit | Attending: Rheumatology | Admitting: Rheumatology

## 2012-09-09 DIAGNOSIS — M8580 Other specified disorders of bone density and structure, unspecified site: Secondary | ICD-10-CM

## 2012-09-09 DIAGNOSIS — Z13820 Encounter for screening for osteoporosis: Secondary | ICD-10-CM | POA: Insufficient documentation

## 2012-09-09 DIAGNOSIS — Z780 Asymptomatic menopausal state: Secondary | ICD-10-CM | POA: Insufficient documentation

## 2012-10-09 ENCOUNTER — Encounter (HOSPITAL_COMMUNITY)
Admission: RE | Admit: 2012-10-09 | Discharge: 2012-10-09 | Disposition: A | Discharge status: Home / Self Care | Payer: 59 | Source: Ambulatory Visit | Attending: Rheumatology | Admitting: Rheumatology

## 2012-10-09 DIAGNOSIS — M069 Rheumatoid arthritis, unspecified: Secondary | ICD-10-CM | POA: Insufficient documentation

## 2012-10-09 MED ORDER — ACETAMINOPHEN 325 MG PO TABS
650.0000 mg | ORAL_TABLET | ORAL | Status: DC
  Administered 2012-10-09: 650 mg via ORAL
  Filled 2012-10-09: qty 2

## 2012-10-09 MED ORDER — METHYLPREDNISOLONE SODIUM SUCC 40 MG IJ SOLR
40.0000 mg | INTRAMUSCULAR | Status: AC | PRN
  Administered 2012-10-09: 40 mg via INTRAVENOUS
  Filled 2012-10-09: qty 1

## 2012-10-09 MED ORDER — SODIUM CHLORIDE 0.9 % IV SOLN
INTRAVENOUS | Status: DC
  Administered 2012-10-09: 09:00:00 via INTRAVENOUS

## 2012-10-09 MED ORDER — DIPHENHYDRAMINE HCL 25 MG PO TABS
25.0000 mg | ORAL_TABLET | ORAL | Status: AC | PRN
  Administered 2012-10-09: 25 mg via ORAL
  Filled 2012-10-09 (×2): qty 1

## 2012-10-09 MED ORDER — SODIUM CHLORIDE 0.9 % IV SOLN
3.0000 mg/kg | Freq: Once | INTRAVENOUS | Status: AC
  Administered 2012-10-09: 200 mg via INTRAVENOUS
  Filled 2012-10-09: qty 20

## 2012-11-03 ENCOUNTER — Telehealth: Payer: Self-pay | Admitting: *Deleted

## 2012-11-03 NOTE — Telephone Encounter (Signed)
Patient called to cancel her lab appt for tomorrow. The patient doesn't want to to reschedule. Desk RN notified.  JMW

## 2012-11-04 ENCOUNTER — Other Ambulatory Visit: Payer: 59 | Admitting: Lab

## 2012-11-08 ENCOUNTER — Other Ambulatory Visit: Payer: Self-pay

## 2012-11-28 ENCOUNTER — Ambulatory Visit: Payer: 59 | Admitting: Internal Medicine

## 2012-12-02 ENCOUNTER — Other Ambulatory Visit (HOSPITAL_COMMUNITY): Payer: Self-pay | Admitting: Rheumatology

## 2012-12-04 ENCOUNTER — Encounter (HOSPITAL_COMMUNITY)
Admission: RE | Admit: 2012-12-04 | Payer: 59 | Source: Ambulatory Visit | Attending: Rheumatology | Admitting: Rheumatology

## 2012-12-30 ENCOUNTER — Other Ambulatory Visit: Payer: 59

## 2012-12-31 ENCOUNTER — Other Ambulatory Visit: Payer: 59 | Admitting: Lab

## 2012-12-31 ENCOUNTER — Other Ambulatory Visit: Payer: Self-pay | Admitting: Oncology

## 2013-01-27 ENCOUNTER — Encounter: Payer: Self-pay | Admitting: Internal Medicine

## 2013-01-27 ENCOUNTER — Ambulatory Visit (INDEPENDENT_AMBULATORY_CARE_PROVIDER_SITE_OTHER): Payer: 59 | Admitting: Internal Medicine

## 2013-01-27 VITALS — BP 118/86 | HR 85 | Temp 98.4°F | Resp 16 | Wt 148.0 lb

## 2013-01-27 DIAGNOSIS — M839 Adult osteomalacia, unspecified: Secondary | ICD-10-CM

## 2013-01-27 DIAGNOSIS — R202 Paresthesia of skin: Secondary | ICD-10-CM

## 2013-01-27 DIAGNOSIS — D509 Iron deficiency anemia, unspecified: Secondary | ICD-10-CM

## 2013-01-27 DIAGNOSIS — R7309 Other abnormal glucose: Secondary | ICD-10-CM

## 2013-01-27 DIAGNOSIS — R739 Hyperglycemia, unspecified: Secondary | ICD-10-CM

## 2013-01-27 DIAGNOSIS — R209 Unspecified disturbances of skin sensation: Secondary | ICD-10-CM

## 2013-01-27 DIAGNOSIS — M069 Rheumatoid arthritis, unspecified: Secondary | ICD-10-CM

## 2013-01-27 NOTE — Assessment & Plan Note (Signed)
I have asked her to f/up with rheum

## 2013-01-27 NOTE — Assessment & Plan Note (Signed)
I will check her A1C to see if she has developed DM2 

## 2013-01-27 NOTE — Assessment & Plan Note (Signed)
I will recheck her Vit D level today 

## 2013-01-27 NOTE — Assessment & Plan Note (Signed)
Her neuro exam is normal today I have offered for her to see neuro I will check her labs today to look for secondary causes

## 2013-01-27 NOTE — Assessment & Plan Note (Signed)
CBC and iron level today.

## 2013-01-27 NOTE — Progress Notes (Signed)
Subjective:    Patient ID: Miranda [name], female    DOB: [birth year], 27 y.o.   MRN: [id number]  Anemia Presents for follow-up visit. Symptoms include malaise/fatigue and paresthesias. There has been no abdominal pain, anorexia, bruising/bleeding easily, confusion, fever, leg swelling, light-headedness, pallor, palpitations, pica or weight loss. Signs of blood loss that are present include menorrhagia. Signs of blood loss that are not present include hematemesis, hematochezia, melena and vaginal bleeding. There are no compliance problems.       Review of Systems  Constitutional: Positive for malaise/fatigue and fatigue. Negative for fever, chills, weight loss, diaphoresis, activity change, appetite change and unexpected weight change.  HENT: Negative.   Eyes: Negative.   Respiratory: Negative.  Negative for cough, chest tightness, shortness of breath, wheezing and stridor.   Cardiovascular: Negative.  Negative for chest pain, palpitations and leg swelling.  Gastrointestinal: Negative.  Negative for abdominal pain, melena, hematochezia, anorexia and hematemesis.  Endocrine: Negative.   Genitourinary: Positive for menstrual problem (heavy periods) and menorrhagia. Negative for dysuria, flank pain and vaginal bleeding.  Musculoskeletal: Positive for myalgias, joint swelling (left wrist) and arthralgias. Negative for back pain and gait problem.  Skin: Negative.  Negative for color change, pallor, rash and wound.  Allergic/Immunologic: Negative.   Neurological: Positive for numbness (and tingling intermittently throughout her body) and paresthesias. Negative for light-headedness.  Hematological: Negative.  Negative for adenopathy. Does not bruise/bleed easily.  Psychiatric/Behavioral: Negative.  Negative for confusion.       Objective:   Physical Exam  Vitals reviewed. Constitutional: She is oriented to person, place, and time. She appears well-developed and well-nourished. No distress.   HENT:  Head: Normocephalic and atraumatic.  Mouth/Throat: Oropharynx is clear and moist. No oropharyngeal exudate.  Eyes: Conjunctivae are normal. Right eye exhibits no discharge. Left eye exhibits no discharge. No scleral icterus.  Neck: Normal range of motion. Neck supple. No JVD present. No tracheal deviation present. No thyromegaly present.  Cardiovascular: Normal rate, regular rhythm, normal heart sounds and intact distal pulses.  Exam reveals no gallop and no friction rub.   No murmur heard. Pulmonary/Chest: Effort normal and breath sounds normal. No stridor. No respiratory distress. She has no wheezes. She has no rales. She exhibits no tenderness.  Abdominal: Soft. Bowel sounds are normal. She exhibits no distension and no mass. There is no tenderness. There is no rebound and no guarding.  Musculoskeletal: Normal range of motion. She exhibits no edema and no tenderness.       Left wrist: She exhibits tenderness, swelling and effusion. She exhibits normal range of motion, no bony tenderness, no crepitus, no deformity and no laceration.  Lymphadenopathy:    She has no cervical adenopathy.  Neurological: She is oriented to person, place, and time.  Skin: Skin is warm and dry. No rash noted. She is not diaphoretic. No erythema. No pallor.  Psychiatric: She has a normal mood and affect. Her behavior is normal. Judgment and thought content normal.      Lab Results  Component Value Date   WBC 5.3 08/25/2012   HGB 10.7* 08/25/2012   HCT 32.6* 08/25/2012   PLT 281 08/25/2012   GLUCOSE 98 08/25/2012   CHOL 193 07/27/2011   TRIG 26.0 07/27/2011   HDL 60.50 07/27/2011   LDLCALC 127* 07/27/2011   ALT 10 08/25/2012   AST 18 08/25/2012   NA 140 08/25/2012   K 3.8 08/25/2012   CL 105 08/25/2012   CREATININE 0.7 08/25/2012   BUN 8.0  08/25/2012   CO2 25 08/25/2012   TSH 0.40 11/30/2011   HGBA1C 5.7 01/01/2012      Assessment & Plan:

## 2013-01-27 NOTE — Patient Instructions (Signed)

## 2013-02-04 ENCOUNTER — Other Ambulatory Visit (INDEPENDENT_AMBULATORY_CARE_PROVIDER_SITE_OTHER): Payer: 59

## 2013-02-04 ENCOUNTER — Encounter: Payer: Self-pay | Admitting: Diagnostic Neuroimaging

## 2013-02-04 ENCOUNTER — Encounter: Payer: Self-pay | Admitting: Internal Medicine

## 2013-02-04 ENCOUNTER — Ambulatory Visit (INDEPENDENT_AMBULATORY_CARE_PROVIDER_SITE_OTHER): Payer: 59 | Admitting: Diagnostic Neuroimaging

## 2013-02-04 VITALS — BP 130/89 | HR 88 | Ht 64.0 in | Wt 147.0 lb

## 2013-02-04 DIAGNOSIS — R209 Unspecified disturbances of skin sensation: Secondary | ICD-10-CM

## 2013-02-04 DIAGNOSIS — R292 Abnormal reflex: Secondary | ICD-10-CM

## 2013-02-04 DIAGNOSIS — R202 Paresthesia of skin: Secondary | ICD-10-CM

## 2013-02-04 DIAGNOSIS — R29898 Other symptoms and signs involving the musculoskeletal system: Secondary | ICD-10-CM

## 2013-02-04 DIAGNOSIS — R739 Hyperglycemia, unspecified: Secondary | ICD-10-CM

## 2013-02-04 DIAGNOSIS — R7309 Other abnormal glucose: Secondary | ICD-10-CM

## 2013-02-04 DIAGNOSIS — H53129 Transient visual loss, unspecified eye: Secondary | ICD-10-CM

## 2013-02-04 DIAGNOSIS — M069 Rheumatoid arthritis, unspecified: Secondary | ICD-10-CM

## 2013-02-04 DIAGNOSIS — M839 Adult osteomalacia, unspecified: Secondary | ICD-10-CM

## 2013-02-04 DIAGNOSIS — D509 Iron deficiency anemia, unspecified: Secondary | ICD-10-CM

## 2013-02-04 DIAGNOSIS — H53121 Transient visual loss, right eye: Secondary | ICD-10-CM

## 2013-02-04 LAB — COMPREHENSIVE METABOLIC PANEL
ALT: 11 U/L (ref 0–35)
AST: 15 U/L (ref 0–37)
CO2: 25 mEq/L (ref 19–32)
Chloride: 103 mEq/L (ref 96–112)
Creatinine, Ser: 0.6 mg/dL (ref 0.4–1.2)
GFR: 154.59 mL/min (ref >60.00)
Sodium: 135 mEq/L (ref 135–145)
Total Bilirubin: 0.7 mg/dL (ref 0.3–1.2)
Total Protein: 7.9 g/dL (ref 6.0–8.3)

## 2013-02-04 LAB — CK: Total CK: 29 U/L (ref 7–177)

## 2013-02-04 LAB — CBC WITH DIFFERENTIAL/PLATELET
Basophils Absolute: 0 10*3/uL (ref 0.0–0.1)
Eosinophils Absolute: 0.1 10*3/uL (ref 0.0–0.7)
HCT: 34.2 % — ABNORMAL LOW (ref 36.0–46.0)
Hemoglobin: 11.4 g/dL — ABNORMAL LOW (ref 12.0–15.0)
Lymphs Abs: 3 10*3/uL (ref 0.7–4.0)
MCHC: 33.2 g/dL (ref 30.0–36.0)
MCV: 87.3 fl (ref 78.0–100.0)
Monocytes Absolute: 0.4 10*3/uL (ref 0.1–1.0)
Neutro Abs: 7.3 10*3/uL (ref 1.4–7.7)
Platelets: 340 10*3/uL (ref 150.0–400.0)
RDW: 14.3 % (ref 11.5–14.6)

## 2013-02-04 LAB — VITAMIN B12: Vitamin B-12: 623 pg/mL (ref 211–911)

## 2013-02-04 LAB — IBC PANEL: Iron: 74 ug/dL (ref 42–145)

## 2013-02-04 LAB — HEMOGLOBIN A1C: Hgb A1c MFr Bld: 5.8 % (ref 4.6–6.5)

## 2013-02-04 LAB — TSH: TSH: 0.63 u[IU]/mL (ref 0.35–5.50)

## 2013-02-04 NOTE — Patient Instructions (Signed)
Request that lab results be sent to our office.  Schedule MRI of head and neck. We will order Visual evoked potentials test to evaluate vision change in right eye.  Return for follow up in 1 month.

## 2013-02-04 NOTE — Progress Notes (Signed)
[facility]  PATIENT: Miranda [name] DOB: 06/26/1986  REFERRING CLINICIAN: Dr. Sanda Linger HISTORY FROM: patient REASON FOR VISIT: new consult   HISTORICAL  CHIEF COMPLAINT:  Chief Complaint  Patient presents with  . Numbness    R arm, arm does not swing when walking  . Pain    sharp pain in back of the head    HISTORY OF PRESENT ILLNESS: 27 year old AA female who comes in today with complaints of numbness in right arm that started in November 2013.  The numbness occurs when she is at work, and she explains that the room where she sits is very cold.  She says it's so cold that her arm goes numb.  When she leaves work and warms up, the numbness goes away.  On 2 occasions the right side of her body went numb from her neck to her foot.  She denies slurred speech, balance problems or pain when these episodes have occurred.  Patient states she has decreased vision in her right eye when she has sharp pain in the back part of her head (points to occipital region).  She states the pain occurs sometimes when she turns her head to the right or looks down. Pain lasts for 45 minutes at a time and goes away on its own.  REVIEW OF SYSTEMS: Full 14 system review of systems performed and notable only for fevers/chills, fatigue, blurred vision, anemia, chest pain, shortness of breath, feeling cold, joint pain, joint swelling, headache, numbness, weakness, difficulty swallowing, and sleepiness.  ALLERGIES: Allergies  Allergen Reactions  . Milk-Related Compounds Itching    Neck itches    HOME MEDICATIONS: Outpatient Prescriptions Prior to Visit  Medication Sig Dispense Refill  . calcium-vitamin D (OSCAL WITH D) 250-125 MG-UNIT per tablet Take 1 tablet by mouth daily.        . cetirizine (ZYRTEC) 10 MG tablet Take 10 mg by mouth daily.      . ferrous sulfate 325 (65 FE) MG tablet Take 325 mg by mouth daily with breakfast. Take 2 tabs in the morning      . GNP SUPER THIN  LANCETS 30G MISC 30 g by Does not apply route 2 (two) times daily.  100 each  0  . hydrOXYzine (ATARAX/VISTARIL) 25 MG tablet Take 25 mg by mouth once.      Marland Kitchen ibuprofen (ADVIL,MOTRIN) 200 MG tablet Take 400-800 mg by mouth every 6 (six) hours as needed.      . Multiple Vitamins-Iron (MULTIVITAMINS WITH IRON) TABS Take 2 tablets by mouth daily.      . ranitidine (ZANTAC) 150 MG tablet Take 150 mg by mouth daily.      . vitamin C (ASCORBIC ACID) 500 MG tablet Take 500 mg by mouth daily.      . Norgestimate-Ethinyl Estradiol Triphasic (ORTHO TRI-CYCLEN LO) 0.18/0.215/0.25 MG-25 MCG tablet Take 1 tablet by mouth daily.  1 Package  11   No facility-administered medications prior to visit.    PAST MEDICAL HISTORY: Past Medical History  Diagnosis Date  . ANEMIA-NOS 2005    Baseline Hgb has been in 9-10 range.   Marland Kitchen PONV (postoperative nausea and vomiting)   . Rheumatoid arthritis     only on prednisone and celebrex  . Chest pain, non-cardiac Feb 2013    " inflammation in chest"    PAST SURGICAL HISTORY: Past Surgical History  Procedure Laterality Date  . Wisdom tooth extraction    . Laparoscopy  03/26/2012  Procedure: LAPAROSCOPY OPERATIVE;  Surgeon: Ok Edwards, MD;  Location: [facility];  Service: Gynecology;  Laterality: N/A;  Pelvic Washings  . Ovarian cyst removal  03/26/2012    Procedure: OVARIAN CYSTECTOMY;  Surgeon: Ok Edwards, MD;  Location: [facility];  Service: Gynecology;  Laterality: Bilateral;  . Dilation and curettage of uterus    . Wrist fusion  05/28/2012    Procedure: ARTHRODESIS WRIST;  Surgeon: Marlowe Shores, MD;  Location: [facility];  Service: Orthopedics;  Laterality: Right;  Right wrist fusion    FAMILY HISTORY: Family History  Problem Relation Age of Onset  . Diabetes Other   . Diabetes Father     SOCIAL HISTORY:  History   Social History  . Marital Status: Single    Spouse Name: N/A    Number of Children: 0  . Years of Education:  BA   Occupational History  .      medical record.    Social History Main Topics  . Smoking status: Never Smoker   . Smokeless tobacco: Never Used  . Alcohol Use: No  . Drug Use: No  . Sexually Active: Not Currently   Other Topics Concern  . Not on file   Social History Narrative   Pt lives at home alone.   Caffeine Use: none     PHYSICAL EXAM  Filed Vitals:   02/04/13 1024  BP: 130/89  Pulse: 88  Height: 5\' 4"  (1.626 m)  Weight: 147 lb (66.679 kg)   Body mass index is 25.22 kg/(m^2).  GENERAL EXAM: Patient is in no distress, well developed, well nourished female.  CARDIOVASCULAR: Regular rate and rhythm, no murmurs, no carotid bruits  NEUROLOGIC: MENTAL STATUS: awake, alert, language fluent, comprehension intact, naming intact, flat affect CRANIAL NERVE: no papilledema on fundoscopic exam, L PUPIL 3mm, R 4mm, SUBTLE RIGHT APD, visual fields full to confrontation, extraocular muscles intact, no nystagmus, facial sensation and strength symmetric, uvula midline, tongue midline, SHOULDER SHRUG ASYMMETRIC, WEAKER ON RIGHT. MOTOR: normal bulk and tone, strength decreased in RUE at biceps/triceps 4/5, LUE 5/5, grips symmetric 5/5. SENSORY: light touch, pinprick, temperature, vibration DECREASED ON RUE COORDINATION: finger-nose-finger, fine finger movements normal REFLEXES: deep tendon reflexes present and symmetric, brisk 3+ GAIT/STATION: narrow based gait; able to walk on toes, heels and tandem; romberg is negative DECREASED RIGHT ARM SWING.   DIAGNOSTIC DATA (LABS, IMAGING, TESTING) - I reviewed patient records, labs, notes, testing and imaging myself where available.  Lab Results  Component Value Date   WBC 5.3 08/25/2012   HGB 10.7* 08/25/2012   HCT 32.6* 08/25/2012   MCV 86.3 08/25/2012   PLT 281 08/25/2012      Component Value Date/Time   NA 140 08/25/2012 0840   NA 140 11/30/2011 2140   K 3.8 08/25/2012 0840   K 3.6 11/30/2011 2140   CL 105 08/25/2012  0840   CL 104 11/30/2011 2140   CO2 25 08/25/2012 0840   CO2 27 10/23/2011 0954   GLUCOSE 98 08/25/2012 0840   GLUCOSE 101* 11/30/2011 2140   BUN 8.0 08/25/2012 0840   BUN 16 11/30/2011 2140   CREATININE 0.7 08/25/2012 0840   CREATININE 0.50 11/30/2011 2140   CALCIUM 9.3 08/25/2012 0840   CALCIUM 9.0 10/23/2011 0954   PROT 8.1 08/25/2012 0840   PROT 8.4* 10/23/2011 0954   ALBUMIN 3.7 08/25/2012 0840   ALBUMIN 3.7 10/23/2011 0954   AST 18 08/25/2012 0840   AST 19 10/23/2011 0954  ALT 10 08/25/2012 0840   ALT 10 10/23/2011 0954   ALKPHOS 96 08/25/2012 0840   ALKPHOS 71 10/23/2011 0954   BILITOT 0.35 08/25/2012 0840   BILITOT 0.2* 10/23/2011 0954   GFRNONAA 140.83 07/14/2010 1111   Lab Results  Component Value Date   CHOL 193 07/27/2011   HDL 60.50 07/27/2011   LDLCALC 127* 07/27/2011   TRIG 26.0 07/27/2011   CHOLHDL 3 07/27/2011   Lab Results  Component Value Date   HGBA1C 5.7 01/01/2012   Lab Results  Component Value Date   VITAMINB12 480 11/30/2011   Lab Results  Component Value Date   TSH 0.40 11/30/2011      ASSESSMENT AND PLAN  27 y.o. year old female  has a past medical history of ANEMIA-NOS (2005); PONV (postoperative nausea and vomiting); Rheumatoid arthritis; and Chest pain, non-cardiac (Feb 2013). here with numbness in her right arm and pain in the back of her head with vision changes in the right eye.  Ddx: CNS demyelinating, inflamm, autoimmune, vascular, metabolic, cervical radiculopathy, neuropathy  Orders Placed This Encounter  Procedures  . MR Brain W Wo Contrast  . MR Cervical Spine W Wo Contrast  . Visual evoked potential test    Suanne Marker, MD (WITH LYNN LAM NP-C 02/04/2013, 12:21 PM) Certified in Neurology, Neurophysiology and Neuroimaging  [facility] [address], [address] [place], [place] [address] [phone]

## 2013-02-05 ENCOUNTER — Other Ambulatory Visit: Payer: Self-pay | Admitting: Internal Medicine

## 2013-02-05 ENCOUNTER — Encounter: Payer: Self-pay | Admitting: Internal Medicine

## 2013-02-05 DIAGNOSIS — M839 Adult osteomalacia, unspecified: Secondary | ICD-10-CM

## 2013-02-05 LAB — VITAMIN D 25 HYDROXY (VIT D DEFICIENCY, FRACTURES): Vit D, 25-Hydroxy: 20 ng/mL — ABNORMAL LOW (ref 30–89)

## 2013-02-05 MED ORDER — CHOLECALCIFEROL 1.25 MG (50000 UT) PO TABS: 1.0000 | ORAL_TABLET | ORAL | Status: DC

## 2013-02-12 ENCOUNTER — Ambulatory Visit (HOSPITAL_COMMUNITY): Admission: RE | Admit: 2013-02-12 | Payer: 59 | Source: Ambulatory Visit

## 2013-02-12 ENCOUNTER — Ambulatory Visit (HOSPITAL_COMMUNITY)
Admission: RE | Admit: 2013-02-12 | Discharge: 2013-02-12 | Disposition: A | Discharge status: Home / Self Care | Payer: 59 | Source: Ambulatory Visit | Attending: Nurse Practitioner | Admitting: Nurse Practitioner

## 2013-02-12 DIAGNOSIS — M069 Rheumatoid arthritis, unspecified: Secondary | ICD-10-CM | POA: Insufficient documentation

## 2013-02-12 DIAGNOSIS — R29898 Other symptoms and signs involving the musculoskeletal system: Secondary | ICD-10-CM

## 2013-02-12 DIAGNOSIS — H539 Unspecified visual disturbance: Secondary | ICD-10-CM | POA: Insufficient documentation

## 2013-02-12 DIAGNOSIS — R292 Abnormal reflex: Secondary | ICD-10-CM

## 2013-02-12 DIAGNOSIS — R209 Unspecified disturbances of skin sensation: Secondary | ICD-10-CM | POA: Insufficient documentation

## 2013-02-12 MED ORDER — GADOBENATE DIMEGLUMINE 529 MG/ML IV SOLN
13.0000 mL | Freq: Once | INTRAVENOUS | Status: AC | PRN
  Administered 2013-02-12: 13 mL via INTRAVENOUS

## 2013-02-25 ENCOUNTER — Telehealth: Payer: Self-pay | Admitting: Nurse Practitioner

## 2013-02-25 NOTE — Telephone Encounter (Signed)
Patient's mother calling wanting patient's MRI results.

## 2013-02-26 ENCOUNTER — Encounter: Payer: Self-pay | Admitting: Diagnostic Neuroimaging

## 2013-02-26 ENCOUNTER — Telehealth: Payer: Self-pay | Admitting: Nurse Practitioner

## 2013-02-26 ENCOUNTER — Other Ambulatory Visit: Payer: Self-pay | Admitting: Nurse Practitioner

## 2013-02-26 DIAGNOSIS — R51 Headache: Secondary | ICD-10-CM

## 2013-02-26 DIAGNOSIS — R200 Anesthesia of skin: Secondary | ICD-10-CM

## 2013-02-26 NOTE — Telephone Encounter (Signed)
Spoke with patient, Miranda [name], and gave results of MRI cervical spine and brain.  No results that explain patient's symptoms were found.  We will order EMG. Asked patient to schedule follow up visit for mid-July with Dr. Marjory Lies. Discussed incidental finding of pituitary tumor, we will order a follow up MRI brain with pituitary in 1 year.

## 2013-03-13 ENCOUNTER — Ambulatory Visit: Payer: 59 | Admitting: Oncology

## 2013-03-13 ENCOUNTER — Other Ambulatory Visit: Payer: 59 | Admitting: Lab

## 2013-03-13 NOTE — Progress Notes (Signed)
2nd no show.  Discharged to PCP.  Return to clinic prn.

## 2013-05-07 ENCOUNTER — Encounter: Payer: Self-pay | Admitting: Internal Medicine

## 2013-05-07 ENCOUNTER — Ambulatory Visit (INDEPENDENT_AMBULATORY_CARE_PROVIDER_SITE_OTHER): Payer: 59 | Admitting: Internal Medicine

## 2013-05-07 ENCOUNTER — Ambulatory Visit (INDEPENDENT_AMBULATORY_CARE_PROVIDER_SITE_OTHER)
Admission: RE | Admit: 2013-05-07 | Discharge: 2013-05-07 | Disposition: A | Discharge status: Home / Self Care | Payer: 59 | Source: Ambulatory Visit | Attending: Internal Medicine | Admitting: Internal Medicine

## 2013-05-07 VITALS — BP 124/68 | HR 84 | Temp 98.5°F | Resp 16 | Wt 148.0 lb

## 2013-05-07 DIAGNOSIS — M25531 Pain in right wrist: Secondary | ICD-10-CM

## 2013-05-07 DIAGNOSIS — M25539 Pain in unspecified wrist: Secondary | ICD-10-CM

## 2013-05-07 NOTE — Patient Instructions (Signed)
Rheumatoid Arthritis Rheumatoid arthritis is a long-term (chronic) inflammatory disease that causes pain, swelling, and stiffness of the joints. It can affect the entire body, including the eyes and lungs. The effects of rheumatoid arthritis vary widely among those with the condition. CAUSES  The cause of rheumatoid arthritis is not known. It tends to run in families and is more common in women. Certain cells of the body's natural defense system (immune system) do not work properly and begin to attack healthy joints. It primarily involves the connective tissue that lines the joints (synovial membrane). This can cause damage to the joint. SYMPTOMS   Pain, stiffness, swelling, and decreased motion of many joints, especially in the hands and feet.  Stiffness that is worse in the morning. It may last 1 2 hours or longer.  Numbness and tingling in the hands.  Fatigue.  Loss of appetite.  Weight loss.  Low-grade fever.  Dry eyes and mouth.  Firm lumps (rheumatoid nodules) that grow beneath the skin in areas such as the elbows and hands. DIAGNOSIS  Diagnosis is based on the symptoms described, an exam, and blood tests. Sometimes, X-rays are helpful. TREATMENT  The goals of treatment are to relieve pain, reduce inflammation, and to slow down or stop joint damage and disability. Methods vary and may include:  Maintaining a balance of rest, exercise, and proper nutrition.  Medicines:  Pain relievers (analgesics).  Corticosteroids and nonsteroidal anti-inflammatory drugs (NSAIDs) to reduce inflammation.  Disease-modifying antirheumatic drugs (DMARDs) to try to slow the course of the disease.  Biologic response modifiers to reduce inflammation and damage.  Physical therapy and occupational therapy.  Surgery for patients with severe joint damage. Joint replacement or fusing of joints may be needed.  Routine monitoring and ongoing care, such as office visits, blood and urine tests, and  X-rays. HOME CARE INSTRUCTIONS   Remain physically active and reduce activity when the disease gets worse.  Eat a well-balanced diet.  Put heat on affected joints when you wake up and before activities. Keep the heat on the affected joint for as long as directed by your caregiver.  Put ice on affected joints following activities or exercising.  Put ice in a plastic bag.  Place a towel between your skin and the bag.  Leave the ice on for 15-20 minutes, 3-4 times a day.  Take all medicines and supplements as directed by your caregiver.  Use splints as directed by your caregiver. Splints help maintain joint position and function.  Do not sleep with pillows under your knees. This may lead to spasms.  Participate in a self-management program to keep current with the latest treatment and coping skills. SEEK IMMEDIATE MEDICAL CARE IF:  You have fainting episodes.  You have periods of extreme weakness.  You rapidly develop a hot, painful joint that is more severe than usual joint aches.  You have chills.  You have a fever. MAKE SURE YOU:  Understand these instructions.  Will watch your condition.  Will get help right away if you are not doing well or get worse. FOR MORE INFORMATION  American College of Rheumatology: www.rheumatology.org Arthritis Foundation: www.arthritis.org Document Released: 09/07/2000 Document Revised: 03/11/2012 Document Reviewed: 10/17/2011 ExitCare Patient Information 2014 [employer].  

## 2013-05-07 NOTE — Progress Notes (Signed)
  Subjective:    Patient ID: Miranda [name], female    DOB: 06-02-1986, 27 y.o.   MRN: [id number]  HPI  She has had right wrist pain for 3 days, no injury or trauma. She wants to check to see if the surgical plate is intact. She is getting relief from the pain with ibuprofen.  Review of Systems  Constitutional: Negative.  Negative for fever, chills, diaphoresis, appetite change and fatigue.  HENT: Negative.   Eyes: Negative.   Respiratory: Negative.  Negative for cough, chest tightness, shortness of breath, wheezing and stridor.   Cardiovascular: Negative.  Negative for chest pain, palpitations and leg swelling.  Gastrointestinal: Negative.  Negative for nausea, vomiting, abdominal pain, diarrhea, constipation and rectal pain.  Endocrine: Negative.   Genitourinary: Negative.   Musculoskeletal: Positive for arthralgias.  Skin: Negative.   Allergic/Immunologic: Negative.   Neurological: Negative.   Hematological: Negative.  Negative for adenopathy. Does not bruise/bleed easily.  Psychiatric/Behavioral: Negative.        Objective:   Physical Exam  Vitals reviewed. Constitutional: She is oriented to person, place, and time. She appears well-developed and well-nourished. No distress.  HENT:  Head: Normocephalic and atraumatic.  Mouth/Throat: Oropharynx is clear and moist. No oropharyngeal exudate.  Eyes: Conjunctivae are normal. Right eye exhibits no discharge. Left eye exhibits no discharge. No scleral icterus.  Neck: Normal range of motion. Neck supple. No JVD present. No tracheal deviation present. No thyromegaly present.  Cardiovascular: Normal rate, regular rhythm, normal heart sounds and intact distal pulses.  Exam reveals no gallop and no friction rub.   No murmur heard. Pulmonary/Chest: Effort normal and breath sounds normal. No stridor. No respiratory distress. She has no wheezes. She has no rales. She exhibits no tenderness.  Abdominal: Soft. Bowel sounds are normal. She  exhibits no distension and no mass. There is no tenderness. There is no rebound and no guarding.  Musculoskeletal:       Right wrist: She exhibits swelling (mild). She exhibits normal range of motion, no tenderness, no bony tenderness, no effusion, no crepitus, no deformity and no laceration.  Lymphadenopathy:    She has no cervical adenopathy.  Neurological: She is oriented to person, place, and time.  Skin: Skin is warm and dry. No rash noted. She is not diaphoretic. No erythema. No pallor.          Assessment & Plan:

## 2013-05-07 NOTE — Assessment & Plan Note (Signed)
The xray looks good

## 2013-06-09 ENCOUNTER — Telehealth: Payer: Self-pay | Admitting: Diagnostic Neuroimaging

## 2013-06-10 NOTE — Telephone Encounter (Signed)
Patient wanted to know does she still need to have visual evoked potential test. Showing ordered in last OV on 02/04/13.

## 2013-06-11 ENCOUNTER — Other Ambulatory Visit: Payer: Self-pay | Admitting: Nurse Practitioner

## 2013-06-11 DIAGNOSIS — H538 Other visual disturbances: Secondary | ICD-10-CM

## 2013-06-11 DIAGNOSIS — R200 Anesthesia of skin: Secondary | ICD-10-CM

## 2013-06-11 NOTE — Telephone Encounter (Signed)
Yes, she was supposed to have the evoked potentials test done.

## 2013-06-11 NOTE — Telephone Encounter (Signed)
Ok

## 2013-06-17 ENCOUNTER — Encounter: Payer: Self-pay | Admitting: Internal Medicine

## 2013-06-24 LAB — HM PAP SMEAR

## 2013-06-26 ENCOUNTER — Encounter: Payer: Self-pay | Admitting: Gynecology

## 2013-06-26 ENCOUNTER — Ambulatory Visit (INDEPENDENT_AMBULATORY_CARE_PROVIDER_SITE_OTHER): Payer: 59 | Admitting: Gynecology

## 2013-06-26 VITALS — BP 128/78 | Ht 63.5 in | Wt 143.0 lb

## 2013-06-26 DIAGNOSIS — Z01419 Encounter for gynecological examination (general) (routine) without abnormal findings: Secondary | ICD-10-CM

## 2013-06-26 NOTE — Progress Notes (Signed)
Miranda [name] Miranda [name] 1985-10-23 [id number]   History:    27 y.o.  for annual gyn exam presented to the office today with no complaints. Review of patient's record  Indicated that in 2013 patient had a laparoscopic bilateral ovarian cystectomy resectoscopic polypectomy on July 3 as a result of bilateral ovarian cyst, menorrhagia, anemia and intrauterine polyps. Her pathology report demonstrated the following:  Diagnosis  1. Ovary, cyst, right  - HEMORRHAGIC CORPUS LUTEAL CYST, NO ATYPIA OR MALIGNANCY.  2. Ovary, cyst, left  - HEMORRHAGIC CORPUS LUTEAL CYST. NO ATYPIA OR MALIGNANCY.  3. Endometrial polyp  - BENIGN ENDOMETRIAL POLYP AND ADJACENT BENIGN SECRETORY ENDOMETRIUM, NO  ATYPIA, HYPERPLASIA OR MALIGNANCY.  - UNDERLYING UNREMARKABLE MYOMETRIUM.  - SMALL POLYPOID FRAGMENT OF BENIGN ENDOCERVICAL MUCOSA, NO DYSPLASIA OR  MALIGNANCY.  Patient is no longer taking the oral contraceptive pills. Patient states she has not been sexually active for over 2 years. Her cycles are regular although they are heavy for a few days. She is taking iron supplementation twice a day. She is being followed by the rheumatologist Dr. Titus Dubin who has recently done her lab work. Patient with no prior history of abnormal Pap smears. Patient scheduled for flu vaccine at work next week    Past medical history,surgical history, family history and social history were all reviewed and documented in the EPIC chart.  Gynecologic History Patient's last menstrual period was 06/07/2013. Contraception: none Last Pap: 2013. Results were: normal Last mammogram: not indicated. Results were: not indicated  Obstetric History OB History  Gravida Para Term Preterm AB SAB TAB Ectopic Multiple Living  0                  ROS: A ROS was performed and pertinent positives and negatives are included in the history.  GENERAL: No fevers or chills. HEENT: No change in vision, no earache, sore throat or sinus congestion. NECK: No  pain or stiffness. CARDIOVASCULAR: No chest pain or pressure. No palpitations. PULMONARY: No shortness of breath, cough or wheeze. GASTROINTESTINAL: No abdominal pain, nausea, vomiting or diarrhea, melena or bright red blood per rectum. GENITOURINARY: No urinary frequency, urgency, hesitancy or dysuria. MUSCULOSKELETAL: No joint or muscle pain, no back pain, no recent trauma. DERMATOLOGIC: No rash, no itching, no lesions. ENDOCRINE: No polyuria, polydipsia, no heat or cold intolerance. No recent change in weight. HEMATOLOGICAL: No anemia or easy bruising or bleeding. NEUROLOGIC: No headache, seizures, numbness, tingling or weakness. PSYCHIATRIC: No depression, no loss of interest in normal activity or change in sleep pattern.     Exam: chaperone present  BP 128/78  Ht 5' 3.5" (1.613 m)  Wt 143 lb (64.864 kg)  BMI 24.93 kg/m2  LMP 06/07/2013  Body mass index is 24.93 kg/(m^2).  General appearance : Well developed well nourished female. No acute distress HEENT: Neck supple, trachea midline, no carotid bruits, no thyroidmegaly Lungs: Clear to auscultation, no rhonchi or wheezes, or rib retractions  Heart: Regular rate and rhythm, no murmurs or gallops Breast:Examined in sitting and supine position were symmetrical in appearance, no palpable masses or tenderness,  no skin retraction, no nipple inversion, no nipple discharge, no skin discoloration, no axillary or supraclavicular lymphadenopathy Abdomen: no palpable masses or tenderness, no rebound or guarding Extremities: no edema or skin discoloration or tenderness  Pelvic:  Bartholin, Urethra, Skene Glands: Within normal limits             Vagina: No gross lesions or discharge  Cervix: No gross lesions or discharge  Uterus  anteverted, normal size, shape and consistency, non-tender and mobile  Adnexa  Without masses or tenderness  Anus and perineum  normal   Rectovaginal  normal sphincter tone without palpated masses or tenderness              Hemoccult none indicated     Assessment/Plan:  27 y.o. female for annual exam with past history of endometrial polyps and bilateral ovarian cysts removed which were benign. Patient with normal menstrual cycles although they are heavy. We had given her the option to consider having a Mirena IUD to cut down on her bleeding and her anemia but she has been adamant to do so in stopping oral contraceptive pills on her own. She will continue to followup with her rheumatologist. Blood work drawn today. Pap smear not done today in accordance with a new guidelines. She was reminded to do her monthly self breast examination.  Note: This dictation was prepared with  Dragon/digital dictation along withSmart phrase technology. Any transcriptional errors that result from this process are unintentional.   Ok Edwards MD, 12:19 PM 06/26/2013

## 2013-07-07 ENCOUNTER — Ambulatory Visit (INDEPENDENT_AMBULATORY_CARE_PROVIDER_SITE_OTHER): Payer: 59

## 2013-07-07 DIAGNOSIS — Z23 Encounter for immunization: Secondary | ICD-10-CM

## 2013-07-30 ENCOUNTER — Telehealth: Payer: Self-pay | Admitting: *Deleted

## 2013-07-30 NOTE — Telephone Encounter (Signed)
Message left for patient to call to schedule VEP and NCV/EMG

## 2013-08-25 ENCOUNTER — Encounter: Payer: Self-pay | Admitting: Internal Medicine

## 2013-08-25 LAB — CBC
BASO%: 0 %
Basophils Manual: 0
Creat: 0.56
Eosinophils Absolute: 0 /uL
Grans (Absolute): 5.5
Granulocyte percent: 57 %G (ref 37–80)
HGB: 10.8 g/dL
Lymphs Abs: 3.5
MCHC: 33.8
MCV: 81 fL
MONO%: 6 %
Monocytes Absolute: 1 /uL
RBC: 3.95
RDW: 16.6
WBC: 9.6

## 2013-12-22 ENCOUNTER — Telehealth: Payer: Self-pay | Admitting: Diagnostic Neuroimaging

## 2013-12-22 NOTE — Telephone Encounter (Signed)
Patient calling wanting to schedule an appointment regarding dizziness to the point that she is about to fall over. Patient states it has been very bad for the past month, states that it is a new issue--advised patient that if it is a new issue that we have not been seeing her for then she would need a new referral sent to our office. Patient states that she believes it is related to what she was being seen for the last time. Please call patient and advise.

## 2013-12-23 NOTE — Telephone Encounter (Signed)
Called and left VM message for patient concerning dizziness, and that Dr Leta Baptist has not seen patient for this and we will need a new referral from [facility],

## 2014-01-10 IMAGING — CR DG WRIST COMPLETE 3+V*R*
2 series · 2 of 2 positions shown · non-contrast
Comparison: None.

CLINICAL DATA: Chronic wrist pain.

RIGHT WRIST - COMPLETE 3+ VIEW

[view not recorded (1 of 2)]
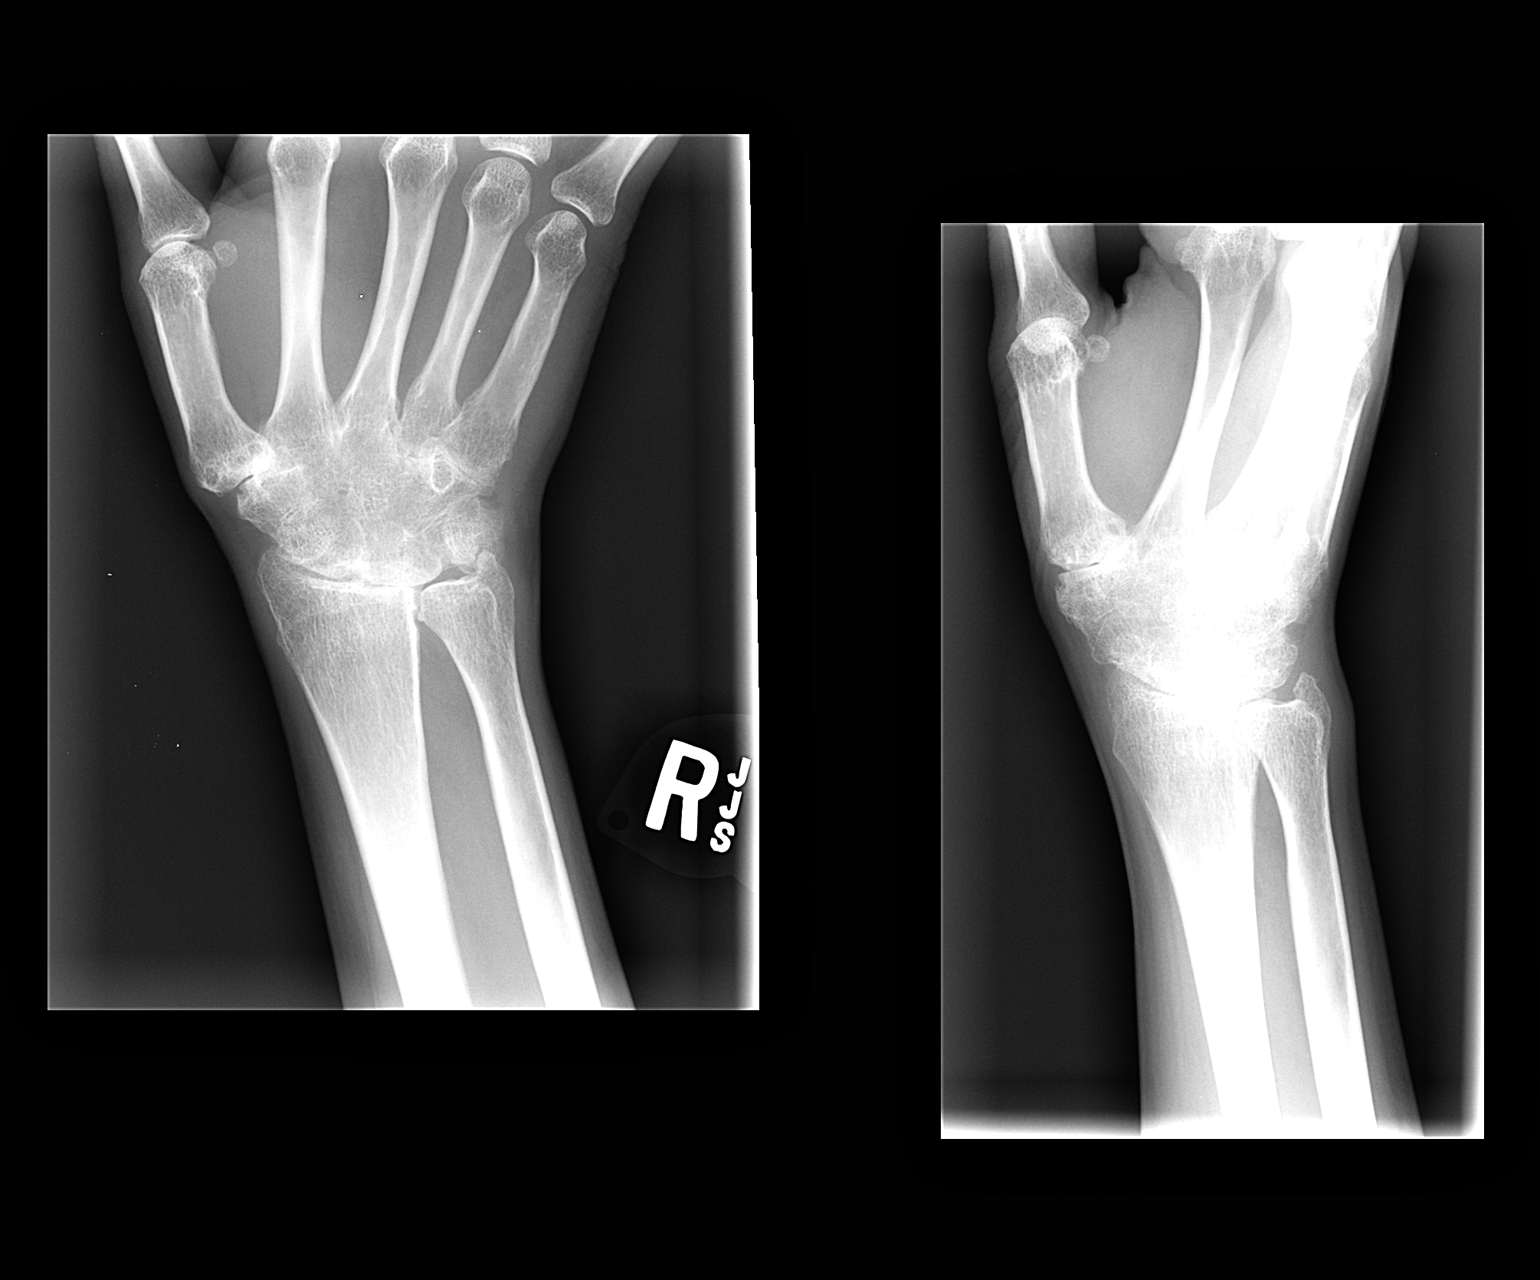

[view not recorded (2 of 2)]
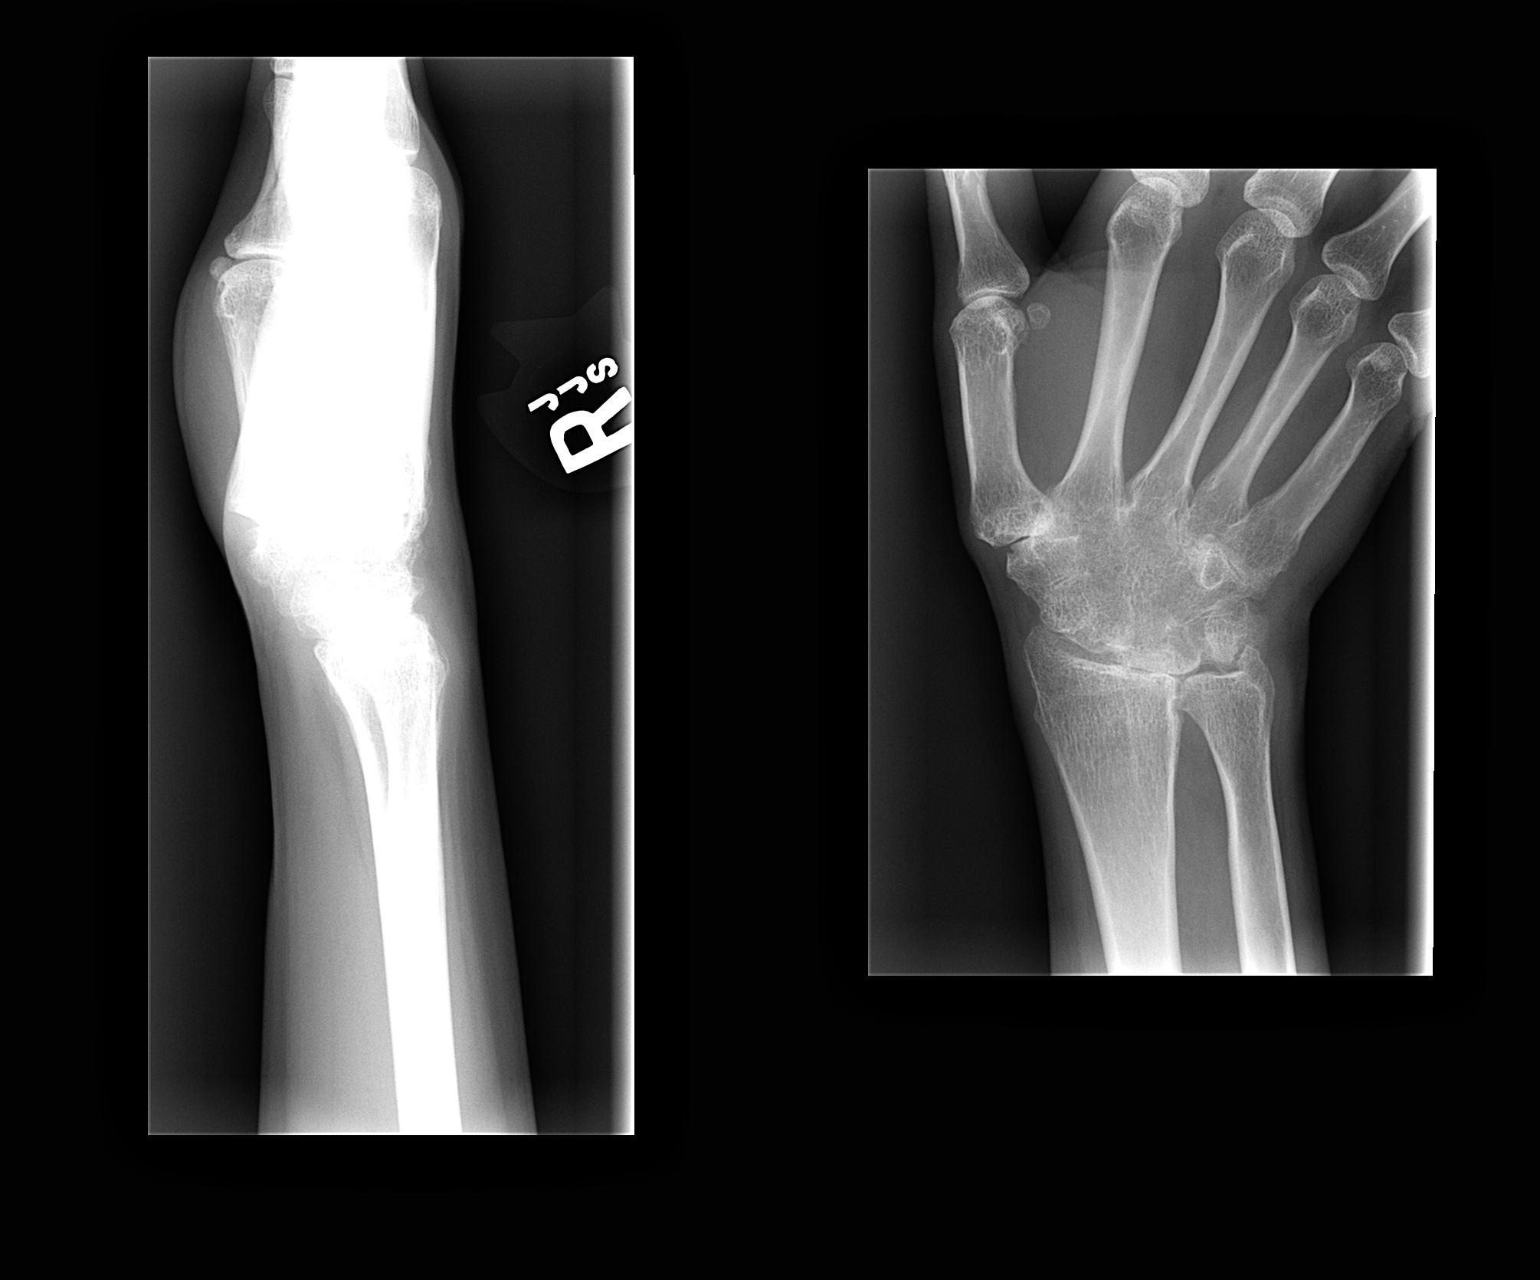

[2 of 2 positions shown; findings below may reference images not displayed]

FINDINGS: No acute bony or joint abnormality is identified.  All of
the carpal bones are solidly fused.  The second through fifth MCP
joints are also fused.  Radiocarpal joint space narrowing is
identified.  The MCP joints appear normal.
IMPRESSION: 1.  Solid fusion of the carpal bones and second through fifth MCP
joints.  No acute abnormality.
2.  Marked radiocarpal joint space narrowing.

## 2014-01-26 ENCOUNTER — Encounter: Payer: Self-pay | Admitting: Family Medicine

## 2014-01-26 ENCOUNTER — Ambulatory Visit (INDEPENDENT_AMBULATORY_CARE_PROVIDER_SITE_OTHER): Payer: 59 | Admitting: Family Medicine

## 2014-01-26 VITALS — BP 118/70 | HR 83 | Wt 132.0 lb

## 2014-01-26 DIAGNOSIS — M549 Dorsalgia, unspecified: Secondary | ICD-10-CM | POA: Insufficient documentation

## 2014-01-26 NOTE — Assessment & Plan Note (Signed)
Patient has had significant back pain that is likely multifactorial secondary to her rheumatoid arthritis and her chronic steroid use. Patient also has significant muscle weakness. We discussed home exercise program, over-the-counter medications, and patient did not want any prescription medications today. We discussed increasing her vitamin D secondary to her rheumatoid arthritis. He also discussed other over-the-counter medications that could be beneficial. Patient will try these interventions and come back again in 2-3 weeks for further evaluation and treatment.

## 2014-01-26 NOTE — Patient Instructions (Signed)
Very nice to met you Try the exercises 3 times a week Continue your vitamin D but add 1000 IU daily  Consider Turmeric 542m twice daily to help with inflammation.   Increase protein in your diet, either protein shakes, beans, meat etc.  Ice is better than heat. Up to 20 minutes at a time Come back in 2-3 weeks.

## 2014-01-26 NOTE — Progress Notes (Signed)
  [facility] [address], [place] [address] Phone: [phone] Subjective:    I'm seeing this patient by the request  of:  Scarlette Calico, MD   CC: Back pain  BOF:[id number] Miranda [name] is a 28 y.o. female coming in with complaint of back pain. Patient does have a past medical history significant for rheumatoid arthritis. Patient states that she is also had maybe a history of scoliosis. Patient has tried ibuprofen for her back pain but states that she is not having a significant improvement. Patient denies any midline pain but states that most of it seems to be right-sided with no significant radiation down her leg. Patient denies any numbness but does feel fatigued overall. Patient denies any bowel or bladder incontinence and denies any trouble with urination. Patient states that the pain is bad enough that it has wake her up at night. Patient raises very a 7/10.     Past medical history, social, surgical and family history all reviewed in electronic medical record.   Review of Systems: No headache, visual changes, nausea, vomiting, diarrhea, constipation, dizziness, abdominal pain, skin rash, fevers, chills, night sweats, weight loss, swollen lymph nodes, body aches, joint swelling, muscle aches, chest pain, shortness of breath, mood changes.   Objective Blood pressure 118/70, pulse 83, weight 132 lb (59.875 kg), SpO2 99.00%.  General: No apparent distress alert and oriented x3 mood and affect normal, dressed appropriately.  HEENT: Pupils equal, extraocular movements intact  Respiratory: Patient's speak in full sentences and does not appear short of breath  Cardiovascular: No lower extremity edema, non tender, no erythema  Skin: Warm dry intact with no signs of infection or rash on extremities or on axial skeleton.  Abdomen: Soft nontender  Neuro: Cranial nerves II through XII are intact, neurovascularly intact in all extremities with 2+ DTRs  and 2+ pulses.  Lymph: No lymphadenopathy of posterior or anterior cervical chain or axillae bilaterally.  Gait normal with good balance and coordination.  MSK:  Non tender with full range of motion and good stability and symmetric strength and tone of shoulders, elbows, wrist, hip, knee and ankles bilaterally. No rheumatoid nodules present. Back Exam:  Inspection: Unremarkable with very mild increasing kyphosis but no true scoliosis Motion: Flexion 45 deg, Extension 35 deg, Side Bending to 35 deg bilaterally,  Rotation to 45 deg bilaterally  SLR laying: Negative  XSLR laying: Negative  Palpable tenderness: Tender to palpation over the paraspinal musculature of the lumbar spine on the right side. FABER: negative. Sensory change: Gross sensation intact to all lumbar and sacral dermatomes.  Reflexes: 2+ at both patellar tendons, 2+ at achilles tendons, Babinski's downgoing.  Strength at foot  Plantar-flexion: 5/5 Dorsi-flexion: 5/5 Eversion: 5/5 Inversion: 5/5  Leg strength  Quad: 5/5 Hamstring: 5/5 Hip flexor: 5/5 Hip abductors: 5/5  Gait unremarkable.     Impression and Recommendations:     This case required medical decision making of moderate complexity.

## 2014-04-07 ENCOUNTER — Ambulatory Visit (INDEPENDENT_AMBULATORY_CARE_PROVIDER_SITE_OTHER): Payer: 59 | Admitting: Internal Medicine

## 2014-04-07 ENCOUNTER — Other Ambulatory Visit (INDEPENDENT_AMBULATORY_CARE_PROVIDER_SITE_OTHER): Payer: 59

## 2014-04-07 ENCOUNTER — Encounter: Payer: Self-pay | Admitting: Internal Medicine

## 2014-04-07 VITALS — BP 120/70 | HR 76 | Temp 98.8°F | Resp 16 | Ht 64.0 in | Wt 134.0 lb

## 2014-04-07 DIAGNOSIS — M069 Rheumatoid arthritis, unspecified: Secondary | ICD-10-CM

## 2014-04-07 DIAGNOSIS — D509 Iron deficiency anemia, unspecified: Secondary | ICD-10-CM

## 2014-04-07 DIAGNOSIS — Z0000 Encounter for general adult medical examination without abnormal findings: Secondary | ICD-10-CM | POA: Insufficient documentation

## 2014-04-07 DIAGNOSIS — M839 Adult osteomalacia, unspecified: Secondary | ICD-10-CM

## 2014-04-07 LAB — CBC WITH DIFFERENTIAL/PLATELET
BASOS ABS: 0 10*3/uL (ref 0.0–0.1)
Basophils Relative: 0.3 % (ref 0.0–3.0)
EOS PCT: 1.5 % (ref 0.0–5.0)
Eosinophils Absolute: 0.1 10*3/uL (ref 0.0–0.7)
HEMATOCRIT: 31.4 % — AB (ref 36.0–46.0)
Hemoglobin: 10.2 g/dL — ABNORMAL LOW (ref 12.0–15.0)
Lymphocytes Relative: 38.6 % (ref 12.0–46.0)
Lymphs Abs: 3.1 10*3/uL (ref 0.7–4.0)
MCHC: 32.6 g/dL (ref 30.0–36.0)
MCV: 82.6 fl (ref 78.0–100.0)
MONOS PCT: 4.4 % (ref 3.0–12.0)
Monocytes Absolute: 0.4 10*3/uL (ref 0.1–1.0)
Neutro Abs: 4.5 10*3/uL (ref 1.4–7.7)
Neutrophils Relative %: 55.2 % (ref 43.0–77.0)
Platelets: 351 10*3/uL (ref 150.0–400.0)
RBC: 3.8 Mil/uL — ABNORMAL LOW (ref 3.87–5.11)
RDW: 14 % (ref 11.5–15.5)
WBC: 8.1 10*3/uL (ref 4.0–10.5)

## 2014-04-07 LAB — RHEUMATOID FACTOR

## 2014-04-07 NOTE — Patient Instructions (Signed)
Preventive Care for Adults A healthy lifestyle and preventive care can promote health and wellness. Preventive health guidelines for women include the following key practices.  A routine yearly physical is a good way to check with your health care provider about your health and preventive screening. It is a chance to share any concerns and updates on your health and to receive a thorough exam.  Visit your dentist for a routine exam and preventive care every 6 months. Brush your teeth twice a day and floss once a day. Good oral hygiene prevents tooth decay and gum disease.  The frequency of eye exams is based on your age, health, family medical history, use of contact lenses, and other factors. Follow your health care provider's recommendations for frequency of eye exams.  Eat a healthy diet. Foods like vegetables, fruits, whole grains, low-fat dairy products, and lean protein foods contain the nutrients you need without too many calories. Decrease your intake of foods high in solid fats, added sugars, and salt. Eat the right amount of calories for you.Get information about a proper diet from your health care provider, if necessary.  Regular physical exercise is one of the most important things you can do for your health. Most adults should get at least 150 minutes of moderate-intensity exercise (any activity that increases your heart rate and causes you to sweat) each week. In addition, most adults need muscle-strengthening exercises on 2 or more days a week.  Maintain a healthy weight. The body mass index (BMI) is a screening tool to identify possible weight problems. It provides an estimate of body fat based on height and weight. Your health care provider can find your BMI, and can help you achieve or maintain a healthy weight.For adults 20 years and older:  A BMI below 18.5 is considered underweight.  A BMI of 18.5 to 24.9 is normal.  A BMI of 25 to 29.9 is considered overweight.  A BMI of  30 and above is considered obese.  Maintain normal blood lipids and cholesterol levels by exercising and minimizing your intake of saturated fat. Eat a balanced diet with plenty of fruit and vegetables. Blood tests for lipids and cholesterol should begin at age 52 and be repeated every 5 years. If your lipid or cholesterol levels are high, you are over 50, or you are at high risk for heart disease, you may need your cholesterol levels checked more frequently.Ongoing high lipid and cholesterol levels should be treated with medicines if diet and exercise are not working.  If you smoke, find out from your health care provider how to quit. If you do not use tobacco, do not start.  Lung cancer screening is recommended for adults aged 37-80 years who are at high risk for developing lung cancer because of a history of smoking. A yearly low-dose CT scan of the lungs is recommended for people who have at least a 30-pack-year history of smoking and are a current smoker or have quit within the past 15 years. A pack year of smoking is smoking an average of 1 pack of cigarettes a day for 1 year (for example: 1 pack a day for 30 years or 2 packs a day for 15 years). Yearly screening should continue until the smoker has stopped smoking for at least 15 years. Yearly screening should be stopped for people who develop a health problem that would prevent them from having lung cancer treatment.  If you are pregnant, do not drink alcohol. If you are breastfeeding,  be very cautious about drinking alcohol. If you are not pregnant and choose to drink alcohol, do not have more than 1 drink per day. One drink is considered to be 12 ounces (355 mL) of beer, 5 ounces (148 mL) of wine, or 1.5 ounces (44 mL) of liquor.  Avoid use of street drugs. Do not share needles with anyone. Ask for help if you need support or instructions about stopping the use of drugs.  High blood pressure causes heart disease and increases the risk of  stroke. Your blood pressure should be checked at least every 1 to 2 years. Ongoing high blood pressure should be treated with medicines if weight loss and exercise do not work.  If you are 3-86 years old, ask your health care provider if you should take aspirin to prevent strokes.  Diabetes screening involves taking a blood sample to check your fasting blood sugar level. This should be done once every 3 years, after age 67, if you are within normal weight and without risk factors for diabetes. Testing should be considered at a younger age or be carried out more frequently if you are overweight and have at least 1 risk factor for diabetes.  Breast cancer screening is essential preventive care for women. You should practice "breast self-awareness." This means understanding the normal appearance and feel of your breasts and may include breast self-examination. Any changes detected, no matter how small, should be reported to a health care provider. Women in their 8s and 30s should have a clinical breast exam (CBE) by a health care provider as part of a regular health exam every 1 to 3 years. After age 70, women should have a CBE every year. Starting at age 25, women should consider having a mammogram (breast X-ray test) every year. Women who have a family history of breast cancer should talk to their health care provider about genetic screening. Women at a high risk of breast cancer should talk to their health care providers about having an MRI and a mammogram every year.  Breast cancer gene (BRCA)-related cancer risk assessment is recommended for women who have family members with BRCA-related cancers. BRCA-related cancers include breast, ovarian, tubal, and peritoneal cancers. Having family members with these cancers may be associated with an increased risk for harmful changes (mutations) in the breast cancer genes BRCA1 and BRCA2. Results of the assessment will determine the need for genetic counseling and  BRCA1 and BRCA2 testing.  Routine pelvic exams to screen for cancer are no longer recommended for nonpregnant women who are considered low risk for cancer of the pelvic organs (ovaries, uterus, and vagina) and who do not have symptoms. Ask your health care provider if a screening pelvic exam is right for you.  If you have had past treatment for cervical cancer or a condition that could lead to cancer, you need Pap tests and screening for cancer for at least 20 years after your treatment. If Pap tests have been discontinued, your risk factors (such as having a new sexual partner) need to be reassessed to determine if screening should be resumed. Some women have medical problems that increase the chance of getting cervical cancer. In these cases, your health care provider may recommend more frequent screening and Pap tests.  The HPV test is an additional test that may be used for cervical cancer screening. The HPV test looks for the virus that can cause the cell changes on the cervix. The cells collected during the Pap test can be  tested for HPV. The HPV test could be used to screen women aged 47 years and older, and should be used in women of any age who have unclear Pap test results. After the age of 36, women should have HPV testing at the same frequency as a Pap test.  Colorectal cancer can be detected and often prevented. Most routine colorectal cancer screening begins at the age of 38 years and continues through age 58 years. However, your health care provider may recommend screening at an earlier age if you have risk factors for colon cancer. On a yearly basis, your health care provider may provide home test kits to check for hidden blood in the stool. Use of a small camera at the end of a tube, to directly examine the colon (sigmoidoscopy or colonoscopy), can detect the earliest forms of colorectal cancer. Talk to your health care provider about this at age 64, when routine screening begins. Direct  exam of the colon should be repeated every 5-10 years through age 21 years, unless early forms of pre-cancerous polyps or small growths are found.  People who are at an increased risk for hepatitis B should be screened for this virus. You are considered at high risk for hepatitis B if:  You were born in a country where hepatitis B occurs often. Talk with your health care provider about which countries are considered high risk.  Your parents were born in a high-risk country and you have not received a shot to protect against hepatitis B (hepatitis B vaccine).  You have HIV or AIDS.  You use needles to inject street drugs.  You live with, or have sex with, someone who has Hepatitis B.  You get hemodialysis treatment.  You take certain medicines for conditions like cancer, organ transplantation, and autoimmune conditions.  Hepatitis C blood testing is recommended for all people born from [birth year] through 1965 and any individual with known risks for hepatitis C.  Practice safe sex. Use condoms and avoid high-risk sexual practices to reduce the spread of sexually transmitted infections (STIs). STIs include gonorrhea, chlamydia, syphilis, trichomonas, herpes, HPV, and human immunodeficiency virus (HIV). Herpes, HIV, and HPV are viral illnesses that have no cure. They can result in disability, cancer, and death.  You should be screened for sexually transmitted illnesses (STIs) including gonorrhea and chlamydia if:  You are sexually active and are younger than 24 years.  You are older than 24 years and your health care provider tells you that you are at risk for this type of infection.  Your sexual activity has changed since you were last screened and you are at an increased risk for chlamydia or gonorrhea. Ask your health care provider if you are at risk.  If you are at risk of being infected with HIV, it is recommended that you take a prescription medicine daily to prevent HIV infection. This is  called preexposure prophylaxis (PrEP). You are considered at risk if:  You are a heterosexual woman, are sexually active, and are at increased risk for HIV infection.  You take drugs by injection.  You are sexually active with a partner who has HIV.  Talk with your health care provider about whether you are at high risk of being infected with HIV. If you choose to begin PrEP, you should first be tested for HIV. You should then be tested every 3 months for as long as you are taking PrEP.  Osteoporosis is a disease in which the bones lose minerals and strength  with aging. This can result in serious bone fractures or breaks. The risk of osteoporosis can be identified using a bone density scan. Women ages 65 years and over and women at risk for fractures or osteoporosis should discuss screening with their health care providers. Ask your health care provider whether you should take a calcium supplement or vitamin D to reduce the rate of osteoporosis.  Menopause can be associated with physical symptoms and risks. Hormone replacement therapy is available to decrease symptoms and risks. You should talk to your health care provider about whether hormone replacement therapy is right for you.  Use sunscreen. Apply sunscreen liberally and repeatedly throughout the day. You should seek shade when your shadow is shorter than you. Protect yourself by wearing long sleeves, pants, a wide-brimmed hat, and sunglasses year round, whenever you are outdoors.  Once a month, do a whole body skin exam, using a mirror to look at the skin on your back. Tell your health care provider of new moles, moles that have irregular borders, moles that are larger than a pencil eraser, or moles that have changed in shape or color.  Stay current with required vaccines (immunizations).  Influenza vaccine. All adults should be immunized every year.  Tetanus, diphtheria, and acellular pertussis (Td, Tdap) vaccine. Pregnant women should  receive 1 dose of Tdap vaccine during each pregnancy. The dose should be obtained regardless of the length of time since the last dose. Immunization is preferred during the 27th-36th week of gestation. An adult who has not previously received Tdap or who does not know her vaccine status should receive 1 dose of Tdap. This initial dose should be followed by tetanus and diphtheria toxoids (Td) booster doses every 10 years. Adults with an unknown or incomplete history of completing a 3-dose immunization series with Td-containing vaccines should begin or complete a primary immunization series including a Tdap dose. Adults should receive a Td booster every 10 years.  Varicella vaccine. An adult without evidence of immunity to varicella should receive 2 doses or a second dose if she has previously received 1 dose. Pregnant females who do not have evidence of immunity should receive the first dose after pregnancy. This first dose should be obtained before leaving the health care facility. The second dose should be obtained 4-8 weeks after the first dose.  Human papillomavirus (HPV) vaccine. Females aged 13-26 years who have not received the vaccine previously should obtain the 3-dose series. The vaccine is not recommended for use in pregnant females. However, pregnancy testing is not needed before receiving a dose. If a female is found to be pregnant after receiving a dose, no treatment is needed. In that case, the remaining doses should be delayed until after the pregnancy. Immunization is recommended for any person with an immunocompromised condition through the age of 26 years if she did not get any or all doses earlier. During the 3-dose series, the second dose should be obtained 4-8 weeks after the first dose. The third dose should be obtained 24 weeks after the first dose and 16 weeks after the second dose.  Zoster vaccine. One dose is recommended for adults aged 60 years or older unless certain conditions are  present.  Measles, mumps, and rubella (MMR) vaccine. Adults born before 1957 generally are considered immune to measles and mumps. Adults born in 1957 or later should have 1 or more doses of MMR vaccine unless there is a contraindication to the vaccine or there is laboratory evidence of immunity to   each of the three diseases. A routine second dose of MMR vaccine should be obtained at least 28 days after the first dose for students attending postsecondary schools, health care workers, or international travelers. People who received inactivated measles vaccine or an unknown type of measles vaccine during 1963-1967 should receive 2 doses of MMR vaccine. People who received inactivated mumps vaccine or an unknown type of mumps vaccine before 1979 and are at high risk for mumps infection should consider immunization with 2 doses of MMR vaccine. For females of childbearing age, rubella immunity should be determined. If there is no evidence of immunity, females who are not pregnant should be vaccinated. If there is no evidence of immunity, females who are pregnant should delay immunization until after pregnancy. Unvaccinated health care workers born before 1957 who lack laboratory evidence of measles, mumps, or rubella immunity or laboratory confirmation of disease should consider measles and mumps immunization with 2 doses of MMR vaccine or rubella immunization with 1 dose of MMR vaccine.  Pneumococcal 13-valent conjugate (PCV13) vaccine. When indicated, a person who is uncertain of her immunization history and has no record of immunization should receive the PCV13 vaccine. An adult aged 19 years or older who has certain medical conditions and has not been previously immunized should receive 1 dose of PCV13 vaccine. This PCV13 should be followed with a dose of pneumococcal polysaccharide (PPSV23) vaccine. The PPSV23 vaccine dose should be obtained at least 8 weeks after the dose of PCV13 vaccine. An adult aged 19  years or older who has certain medical conditions and previously received 1 or more doses of PPSV23 vaccine should receive 1 dose of PCV13. The PCV13 vaccine dose should be obtained 1 or more years after the last PPSV23 vaccine dose.  Pneumococcal polysaccharide (PPSV23) vaccine. When PCV13 is also indicated, PCV13 should be obtained first. All adults aged 65 years and older should be immunized. An adult younger than age 65 years who has certain medical conditions should be immunized. Any person who resides in a nursing home or long-term care facility should be immunized. An adult smoker should be immunized. People with an immunocompromised condition and certain other conditions should receive both PCV13 and PPSV23 vaccines. People with human immunodeficiency virus (HIV) infection should be immunized as soon as possible after diagnosis. Immunization during chemotherapy or radiation therapy should be avoided. Routine use of PPSV23 vaccine is not recommended for American Indians, [place] Natives, or people younger than 65 years unless there are medical conditions that require PPSV23 vaccine. When indicated, people who have unknown immunization and have no record of immunization should receive PPSV23 vaccine. One-time revaccination 5 years after the first dose of PPSV23 is recommended for people aged 19-64 years who have chronic kidney failure, nephrotic syndrome, asplenia, or immunocompromised conditions. People who received 1-2 doses of PPSV23 before age 65 years should receive another dose of PPSV23 vaccine at age 65 years or later if at least 5 years have passed since the previous dose. Doses of PPSV23 are not needed for people immunized with PPSV23 at or after age 65 years.  Meningococcal vaccine. Adults with asplenia or persistent complement component deficiencies should receive 2 doses of quadrivalent meningococcal conjugate (MenACWY-D) vaccine. The doses should be obtained at least 2 months apart.  Microbiologists working with certain meningococcal bacteria, military recruits, people at risk during an outbreak, and people who travel to or live in countries with a high rate of meningitis should be immunized. A first-year college student up through age   21 years who is living in a residence hall should receive a dose if she did not receive a dose on or after her 16th birthday. Adults who have certain high-risk conditions should receive one or more doses of vaccine.  Hepatitis A vaccine. Adults who wish to be protected from this disease, have certain high-risk conditions, work with hepatitis A-infected animals, work in hepatitis A research labs, or travel to or work in countries with a high rate of hepatitis A should be immunized. Adults who were previously unvaccinated and who anticipate close contact with an international adoptee during the first 60 days after arrival in the [place] from a country with a high rate of hepatitis A should be immunized.  Hepatitis B vaccine. Adults who wish to be protected from this disease, have certain high-risk conditions, may be exposed to blood or other infectious body fluids, are household contacts or sex partners of hepatitis B positive people, are clients or workers in certain care facilities, or travel to or work in countries with a high rate of hepatitis B should be immunized.  Haemophilus influenzae type b (Hib) vaccine. A previously unvaccinated person with asplenia or sickle cell disease or having a scheduled splenectomy should receive 1 dose of Hib vaccine. Regardless of previous immunization, a recipient of a hematopoietic stem cell transplant should receive a 3-dose series 6-12 months after her successful transplant. Hib vaccine is not recommended for adults with HIV infection. Preventive Services / Frequency Ages 43 to 39years  Blood pressure check.** / Every 1 to 2 years.  Lipid and cholesterol check.** / Every 5 years beginning at age  75.  Clinical breast exam.** / Every 3 years for women in their 32s and 74s.  BRCA-related cancer risk assessment.** / For women who have family members with a BRCA-related cancer (breast, ovarian, tubal, or peritoneal cancers).  Pap test.** / Every 2 years from ages 65 through 91. Every 3 years starting at age 34 through age 93 or 72 with a history of 3 consecutive normal Pap tests.  HPV screening.** / Every 3 years from ages 46 through ages 53 to 26 with a history of 3 consecutive normal Pap tests.  Hepatitis C blood test.** / For any individual with known risks for hepatitis C.  Skin self-exam. / Monthly.  Influenza vaccine. / Every year.  Tetanus, diphtheria, and acellular pertussis (Tdap, Td) vaccine.** / Consult your health care provider. Pregnant women should receive 1 dose of Tdap vaccine during each pregnancy. 1 dose of Td every 10 years.  Varicella vaccine.** / Consult your health care provider. Pregnant females who do not have evidence of immunity should receive the first dose after pregnancy.  HPV vaccine. / 3 doses over 6 months, if 70 and younger. The vaccine is not recommended for use in pregnant females. However, pregnancy testing is not needed before receiving a dose.  Measles, mumps, rubella (MMR) vaccine.** / You need at least 1 dose of MMR if you were born in 1957 or later. You may also need a 2nd dose. For females of childbearing age, rubella immunity should be determined. If there is no evidence of immunity, females who are not pregnant should be vaccinated. If there is no evidence of immunity, females who are pregnant should delay immunization until after pregnancy.  Pneumococcal 13-valent conjugate (PCV13) vaccine.** / Consult your health care provider.  Pneumococcal polysaccharide (PPSV23) vaccine.** / 1 to 2 doses if you smoke cigarettes or if you have certain conditions.  Meningococcal vaccine.** /  1 dose if you are age 70 to 51 years and a [place] living in a residence hall, or have one of several medical conditions, you need to get vaccinated against meningococcal disease. You may also need additional booster doses.  Hepatitis A vaccine.** / Consult your health care provider.  Hepatitis B vaccine.** / Consult your health care provider.  Haemophilus influenzae type b (Hib) vaccine.** / Consult your health care provider. Ages 40 to 64years  Blood pressure check.** / Every 1 to 2 years.  Lipid and cholesterol check.** / Every 5 years beginning at age 58 years.  Lung cancer screening. / Every year if you are aged 56-80 years and have a 30-pack-year history of smoking and currently smoke or have quit within the past 15 years. Yearly screening is stopped once you have quit smoking for at least 15 years or develop a health problem that would prevent you from having lung cancer treatment.  Clinical breast exam.** / Every year after age 35 years.  BRCA-related cancer risk assessment.** / For women who have family members with a BRCA-related cancer (breast, ovarian, tubal, or peritoneal cancers).  Mammogram.** / Every year beginning at age 109 years and continuing for as long as you are in good health. Consult with your health care provider.  Pap test.** / Every 3 years starting at age 44 years through age 94 or 70 years with a history of 3 consecutive normal Pap tests.  HPV screening.** / Every 3 years from ages 109 years through ages 50 to 30 years with a history of 3 consecutive normal Pap tests.  Fecal occult blood test (FOBT) of stool. / Every year beginning at age 73 years and continuing until age 59 years. You may not need to do this test if you get a colonoscopy every 10 years.  Flexible sigmoidoscopy or colonoscopy.** / Every 5 years for a flexible sigmoidoscopy or every 10 years for a colonoscopy beginning at age 68 years and continuing until age 12 years.  Hepatitis C blood test.** / For all people born from [birth year] through  1965 and any individual with known risks for hepatitis C.  Skin self-exam. / Monthly.  Influenza vaccine. / Every year.  Tetanus, diphtheria, and acellular pertussis (Tdap/Td) vaccine.** / Consult your health care provider. Pregnant women should receive 1 dose of Tdap vaccine during each pregnancy. 1 dose of Td every 10 years.  Varicella vaccine.** / Consult your health care provider. Pregnant females who do not have evidence of immunity should receive the first dose after pregnancy.  Zoster vaccine.** / 1 dose for adults aged 2 years or older.  Measles, mumps, rubella (MMR) vaccine.** / You need at least 1 dose of MMR if you were born in 1957 or later. You may also need a 2nd dose. For females of childbearing age, rubella immunity should be determined. If there is no evidence of immunity, females who are not pregnant should be vaccinated. If there is no evidence of immunity, females who are pregnant should delay immunization until after pregnancy.  Pneumococcal 13-valent conjugate (PCV13) vaccine.** / Consult your health care provider.  Pneumococcal polysaccharide (PPSV23) vaccine.** / 1 to 2 doses if you smoke cigarettes or if you have certain conditions.  Meningococcal vaccine.** / Consult your health care provider.  Hepatitis A vaccine.** / Consult your health care provider.  Hepatitis B vaccine.** / Consult your health care provider.  Haemophilus influenzae type b (Hib) vaccine.** / Consult your health care provider. Ages 48 years  and over  Blood pressure check.** / Every 1 to 2 years.  Lipid and cholesterol check.** / Every 5 years beginning at age 77 years.  Lung cancer screening. / Every year if you are aged 75-80 years and have a 30-pack-year history of smoking and currently smoke or have quit within the past 15 years. Yearly screening is stopped once you have quit smoking for at least 15 years or develop a health problem that would prevent you from having lung cancer  treatment.  Clinical breast exam.** / Every year after age 30 years.  BRCA-related cancer risk assessment.** / For women who have family members with a BRCA-related cancer (breast, ovarian, tubal, or peritoneal cancers).  Mammogram.** / Every year beginning at age 4 years and continuing for as long as you are in good health. Consult with your health care provider.  Pap test.** / Every 3 years starting at age 30 years through age 55 or 34 years with 3 consecutive normal Pap tests. Testing can be stopped between 65 and 70 years with 3 consecutive normal Pap tests and no abnormal Pap or HPV tests in the past 10 years.  HPV screening.** / Every 3 years from ages 51 years through ages 25 or 19 years with a history of 3 consecutive normal Pap tests. Testing can be stopped between 65 and 70 years with 3 consecutive normal Pap tests and no abnormal Pap or HPV tests in the past 10 years.  Fecal occult blood test (FOBT) of stool. / Every year beginning at age 70 years and continuing until age 35 years. You may not need to do this test if you get a colonoscopy every 10 years.  Flexible sigmoidoscopy or colonoscopy.** / Every 5 years for a flexible sigmoidoscopy or every 10 years for a colonoscopy beginning at age 32 years and continuing until age 55 years.  Hepatitis C blood test.** / For all people born from [birth year] through 1967 and any individual with known risks for hepatitis C.  Osteoporosis screening.** / A one-time screening for women ages 4 years and over and women at risk for fractures or osteoporosis.  Skin self-exam. / Monthly.  Influenza vaccine. / Every year.  Tetanus, diphtheria, and acellular pertussis (Tdap/Td) vaccine.** / 1 dose of Td every 10 years.  Varicella vaccine.** / Consult your health care provider.  Zoster vaccine.** / 1 dose for adults aged 57 years or older.  Pneumococcal 13-valent conjugate (PCV13) vaccine.** / Consult your health care provider.  Pneumococcal  polysaccharide (PPSV23) vaccine.** / 1 dose for all adults aged 26 years and older.  Meningococcal vaccine.** / Consult your health care provider.  Hepatitis A vaccine.** / Consult your health care provider.  Hepatitis B vaccine.** / Consult your health care provider.  Haemophilus influenzae type b (Hib) vaccine.** / Consult your health care provider. ** Family history and personal history of risk and conditions may change your health care provider's recommendations. Document Released: 11/06/2001 Document Revised: 09/15/2013 Document Reviewed: 02/05/2011 [facility] Patient Information 2015 [place], [place]. This information is not intended to replace advice given to you by your health care provider. Make sure you discuss any questions you have with your health care provider. Iron Deficiency Anemia Anemia is a condition in which there are less red blood cells or hemoglobin in the blood than normal. Hemoglobin is the part of red blood cells that carries oxygen. Iron deficiency anemia is anemia caused by too little iron. It is the most common type of anemia. It may leave you  tired and short of breath. CAUSES   Lack of iron in the diet.  Poor absorption of iron, as seen with intestinal disorders.  Intestinal bleeding.  Heavy periods. SIGNS AND SYMPTOMS  Mild anemia may not be noticeable. Symptoms may include:  Fatigue.  Headache.  Pale skin.  Weakness.  Tiredness.  Shortness of breath.  Dizziness.  Cold hands and feet.  Fast or irregular heartbeat. DIAGNOSIS  Diagnosis requires a thorough evaluation and physical exam by your health care provider. Blood tests are generally used to confirm iron deficiency anemia. Additional tests may be done to find the underlying cause of your anemia. These may include:  Testing for blood in the stool (fecal occult blood test).  A procedure to see inside the colon and rectum (colonoscopy).  A procedure to see inside the esophagus and stomach  (endoscopy). TREATMENT  Iron deficiency anemia is treated by correcting the cause of the deficiency. Treatment may involve:  Adding iron-rich foods to your diet.  Taking iron supplements. Pregnant or breastfeeding women need to take extra iron because their normal diet usually does not provide the required amount.  Taking vitamins. Vitamin C improves the absorption of iron. Your health care provider may recommend that you take your iron tablets with a glass of orange juice or vitamin C supplement.  Medicines to make heavy menstrual flow lighter.  Surgery. HOME CARE INSTRUCTIONS   Take iron as directed by your health care provider.  If you cannot tolerate taking iron supplements by mouth, talk to your health care provider about taking them through a vein (intravenously) or an injection into a muscle.  For the best iron absorption, iron supplements should be taken on an empty stomach. If you cannot tolerate them on an empty stomach, you may need to take them with food.  Do not drink milk or take antacids at the same time as your iron supplements. Milk and antacids may interfere with the absorption of iron.  Iron supplements can cause constipation. Make sure to include fiber in your diet to prevent constipation. A stool softener may also be recommended.  Take vitamins as directed by your health care provider.  Eat a diet rich in iron. Foods high in iron include liver, lean beef, whole-grain bread, eggs, dried fruit, and dark green leafy vegetables. SEEK IMMEDIATE MEDICAL CARE IF:   You faint. If this happens, do not drive. Call your local emergency services (911 in [place] if no other help is available.  You have chest pain.  You feel nauseous or vomit.  You have severe or increased shortness of breath with activity.  You feel weak.  You have a rapid heartbeat.  You have unexplained sweating.  You become light-headed when getting up from a chair or bed. MAKE SURE YOU:    Understand these instructions.  Will watch your condition.  Will get help right away if you are not doing well or get worse. Document Released: 09/07/2000 Document Revised: 09/15/2013 Document Reviewed: 05/18/2013 [facility] Patient Information 2015 [place], [place]. This information is not intended to replace advice given to you by your health care provider. Make sure you discuss any questions you have with your health care provider.

## 2014-04-07 NOTE — Progress Notes (Signed)
Subjective:    Patient ID: Miranda [name], female    DOB: November 16, 1985, 28 y.o.   MRN: [id number]  HPI Comments: She returns for a CPX but also asks me to retest her for RA - she questions whether or not this is the accurate diagnosis or if she has received the correct treatment. Overall her arthritis pain is much better and she rarely has swelling in her right wrist. She tells me that she is not currently on any immunomodulaters.     Review of Systems  Constitutional: Negative.  Negative for fever, chills, diaphoresis, activity change, appetite change, fatigue and unexpected weight change.  HENT: Negative.   Eyes: Negative.   Respiratory: Negative.  Negative for apnea, cough, choking, chest tightness, shortness of breath, wheezing and stridor.   Cardiovascular: Negative.  Negative for chest pain, palpitations and leg swelling.  Gastrointestinal: Negative.  Negative for nausea, vomiting, abdominal pain, diarrhea, constipation, blood in stool, abdominal distention, anal bleeding and rectal pain.  Endocrine: Negative.   Genitourinary: Positive for menstrual problem (heavy periods). Negative for dysuria, urgency, frequency, hematuria, flank pain, decreased urine volume, vaginal bleeding, vaginal discharge, enuresis, difficulty urinating, genital sores, vaginal pain, pelvic pain and dyspareunia.  Musculoskeletal: Positive for arthralgias (wrists, hands). Negative for back pain, gait problem, joint swelling, myalgias, neck pain and neck stiffness.  Skin: Negative.  Negative for rash.  Allergic/Immunologic: Negative.   Neurological: Negative.   Hematological: Negative.  Negative for adenopathy. Does not bruise/bleed easily.  Psychiatric/Behavioral: Negative.        Objective:   Physical Exam  Vitals reviewed. Constitutional: She is oriented to person, place, and time. She appears well-developed and well-nourished. No distress.  HENT:  Head: Normocephalic and atraumatic.  Mouth/Throat:  Oropharynx is clear and moist. No oropharyngeal exudate.  Eyes: Conjunctivae are normal. Right eye exhibits no discharge. Left eye exhibits no discharge. No scleral icterus.  Neck: Normal range of motion. Neck supple. No JVD present. No tracheal deviation present. No thyromegaly present.  Cardiovascular: Normal rate, regular rhythm, normal heart sounds and intact distal pulses.  Exam reveals no gallop and no friction rub.   No murmur heard. Pulmonary/Chest: Effort normal and breath sounds normal. No stridor. No respiratory distress. She has no wheezes. She has no rales. She exhibits no tenderness.  Abdominal: Soft. Bowel sounds are normal. She exhibits no distension and no mass. There is no tenderness. There is no rebound and no guarding.  Musculoskeletal: She exhibits no edema and no tenderness.       Right elbow: Normal.      Left elbow: Normal.       Right wrist: She exhibits decreased range of motion, swelling (mild) and effusion. She exhibits no tenderness, no bony tenderness, no crepitus, no deformity and no laceration.       Left wrist: Normal. She exhibits normal range of motion, no tenderness, no bony tenderness, no swelling, no effusion, no crepitus, no deformity and no laceration.       Right hand: She exhibits normal range of motion, no tenderness, no bony tenderness, normal capillary refill, no deformity, no laceration and no swelling. Normal sensation noted.       Left hand: Normal. She exhibits normal range of motion, no tenderness, no bony tenderness, normal capillary refill, no deformity, no laceration and no swelling. Normal sensation noted.  Lymphadenopathy:    She has no cervical adenopathy.  Neurological: She is oriented to person, place, and time.  Skin: Skin is warm and dry.  No rash noted. She is not diaphoretic. No erythema. No pallor.  Psychiatric: She has a normal mood and affect. Her behavior is normal. Judgment and thought content normal.     Lab Results  Component  Value Date   WBC 9.6 08/24/2013   HGB 10.8 08/24/2013   HCT 32 08/24/2013   PLT 340.0 02/04/2013   GLUCOSE 87 02/04/2013   CHOL 193 07/27/2011   TRIG 26.0 07/27/2011   HDL 60.50 07/27/2011   LDLCALC 127* 07/27/2011   ALT 8 08/24/2013   AST 14 08/24/2013   NA 135 02/04/2013   K 3.6 02/04/2013   CL 103 02/04/2013   CREATININE 0.56 08/24/2013   BUN 8 02/04/2013   CO2 25 02/04/2013   TSH 0.63 02/04/2013   HGBA1C 5.8 02/04/2013       Assessment & Plan:

## 2014-04-07 NOTE — Progress Notes (Signed)
Pre visit review using our clinic review tool, if applicable. No additional management support is needed unless otherwise documented below in the visit note. 

## 2014-04-08 ENCOUNTER — Encounter: Payer: Self-pay | Admitting: Internal Medicine

## 2014-04-08 LAB — LIPID PANEL
Cholesterol: 146 mg/dL (ref 0–200)
HDL: 39.9 mg/dL (ref >39.00)
LDL Cholesterol: 96 mg/dL (ref 0–99)
NonHDL: 106.1
Total CHOL/HDL Ratio: 4
Triglycerides: 51 mg/dL (ref 0.0–149.0)
VLDL: 10.2 mg/dL (ref 0.0–40.0)

## 2014-04-08 LAB — COMPREHENSIVE METABOLIC PANEL
ALT: 16 U/L (ref 0–35)
AST: 22 U/L (ref 0–37)
Albumin: 3.9 g/dL (ref 3.5–5.2)
Alkaline Phosphatase: 77 U/L (ref 39–117)
BILIRUBIN TOTAL: 0.2 mg/dL (ref 0.2–1.2)
BUN: 17 mg/dL (ref 6–23)
CALCIUM: 9.3 mg/dL (ref 8.4–10.5)
CHLORIDE: 102 meq/L (ref 96–112)
CO2: 26 mEq/L (ref 19–32)
CREATININE: 0.5 mg/dL (ref 0.4–1.2)
GFR: 181.45 mL/min (ref >60.00)
GLUCOSE: 106 mg/dL — AB (ref 70–99)
Potassium: 3.9 mEq/L (ref 3.5–5.1)
SODIUM: 134 meq/L — AB (ref 135–145)
TOTAL PROTEIN: 8.7 g/dL — AB (ref 6.0–8.3)

## 2014-04-08 LAB — TSH: TSH: 0.55 u[IU]/mL (ref 0.35–4.50)

## 2014-04-08 LAB — FERRITIN: Ferritin: 224.4 ng/mL (ref 10.0–291.0)

## 2014-04-08 LAB — IBC PANEL
IRON: 32 ug/dL — AB (ref 42–145)
SATURATION RATIOS: 10 % — AB (ref 20.0–50.0)
TRANSFERRIN: 228.5 mg/dL (ref 212.0–360.0)

## 2014-04-08 LAB — VITAMIN D 25 HYDROXY (VIT D DEFICIENCY, FRACTURES): VITD: 42.7 ng/mL

## 2014-04-08 LAB — CYCLIC CITRUL PEPTIDE ANTIBODY, IGG: Cyclic Citrullin Peptide Ab: <2 U/mL (ref 0.0–5.0)

## 2014-04-08 LAB — ANA: Anti Nuclear Antibody(ANA): NEGATIVE

## 2014-04-08 LAB — ANTI-SMITH ANTIBODY: ENA SM Ab Ser-aCnc: <1

## 2014-04-08 NOTE — Assessment & Plan Note (Signed)
I retested her titers for connective tissue disease are all are normal I have encouraged her to get a second opinion about her RA diagnosis and treatment

## 2014-04-08 NOTE — Assessment & Plan Note (Signed)
Her iron level remains slightly low and her anemia has worsened some I have asked her to be more compliant with her iron replacement therapy

## 2014-04-08 NOTE — Assessment & Plan Note (Signed)
Her VIt D level is in the normal range

## 2014-04-08 NOTE — Assessment & Plan Note (Signed)
Exam done Vaccines were reviewed Labs ordered Pt ed material was given 

## 2014-04-28 NOTE — Telephone Encounter (Signed)
Noted  

## 2014-07-09 ENCOUNTER — Ambulatory Visit: Payer: 59 | Admitting: Internal Medicine

## 2014-07-13 ENCOUNTER — Ambulatory Visit (INDEPENDENT_AMBULATORY_CARE_PROVIDER_SITE_OTHER): Payer: 59

## 2014-07-13 DIAGNOSIS — Z23 Encounter for immunization: Secondary | ICD-10-CM

## 2014-07-25 DIAGNOSIS — T8484XA Pain due to internal orthopedic prosthetic devices, implants and grafts, initial encounter: Secondary | ICD-10-CM

## 2014-07-25 HISTORY — DX: Pain due to internal orthopedic prosthetic devices, implants and grafts, initial encounter: T84.84XA

## 2014-08-06 ENCOUNTER — Encounter (HOSPITAL_BASED_OUTPATIENT_CLINIC_OR_DEPARTMENT_OTHER): Payer: Self-pay | Admitting: *Deleted

## 2014-08-06 ENCOUNTER — Encounter: Payer: 59 | Admitting: Gynecology

## 2014-08-10 ENCOUNTER — Other Ambulatory Visit: Payer: Self-pay | Admitting: Orthopedic Surgery

## 2014-08-11 ENCOUNTER — Encounter (HOSPITAL_BASED_OUTPATIENT_CLINIC_OR_DEPARTMENT_OTHER)
Admission: RE | Disposition: A | Discharge status: Home / Self Care | Payer: Self-pay | Source: Ambulatory Visit | Attending: Orthopedic Surgery

## 2014-08-11 ENCOUNTER — Encounter (HOSPITAL_BASED_OUTPATIENT_CLINIC_OR_DEPARTMENT_OTHER): Payer: Self-pay | Admitting: Certified Registered"

## 2014-08-11 ENCOUNTER — Ambulatory Visit (HOSPITAL_BASED_OUTPATIENT_CLINIC_OR_DEPARTMENT_OTHER): Payer: 59 | Admitting: Certified Registered"

## 2014-08-11 ENCOUNTER — Ambulatory Visit (HOSPITAL_BASED_OUTPATIENT_CLINIC_OR_DEPARTMENT_OTHER)
Admission: RE | Admit: 2014-08-11 | Discharge: 2014-08-11 | Disposition: A | Discharge status: Home / Self Care | Payer: 59 | Source: Ambulatory Visit | Attending: Orthopedic Surgery | Admitting: Orthopedic Surgery

## 2014-08-11 DIAGNOSIS — T8484XA Pain due to internal orthopedic prosthetic devices, implants and grafts, initial encounter: Secondary | ICD-10-CM | POA: Insufficient documentation

## 2014-08-11 DIAGNOSIS — Y838 Other surgical procedures as the cause of abnormal reaction of the patient, or of later complication, without mention of misadventure at the time of the procedure: Secondary | ICD-10-CM | POA: Insufficient documentation

## 2014-08-11 DIAGNOSIS — J45909 Unspecified asthma, uncomplicated: Secondary | ICD-10-CM | POA: Diagnosis not present

## 2014-08-11 DIAGNOSIS — D649 Anemia, unspecified: Secondary | ICD-10-CM | POA: Diagnosis not present

## 2014-08-11 DIAGNOSIS — M65831 Other synovitis and tenosynovitis, right forearm: Secondary | ICD-10-CM | POA: Diagnosis not present

## 2014-08-11 DIAGNOSIS — M069 Rheumatoid arthritis, unspecified: Secondary | ICD-10-CM | POA: Insufficient documentation

## 2014-08-11 DIAGNOSIS — Y9289 Other specified places as the place of occurrence of the external cause: Secondary | ICD-10-CM | POA: Diagnosis not present

## 2014-08-11 HISTORY — PX: TENOLYSIS: SHX396

## 2014-08-11 HISTORY — DX: Pain due to internal orthopedic prosthetic devices, implants and grafts, initial encounter: T84.84XA

## 2014-08-11 HISTORY — PX: HARDWARE REMOVAL: SHX979

## 2014-08-11 HISTORY — DX: Anemia, unspecified: D64.9

## 2014-08-11 HISTORY — DX: Personal history of other diseases of the respiratory system: Z87.09

## 2014-08-11 LAB — POCT HEMOGLOBIN-HEMACUE: Hemoglobin: 9.6 g/dL — ABNORMAL LOW (ref 12.0–15.0)

## 2014-08-11 SURGERY — REMOVAL, HARDWARE
Anesthesia: General | Site: Wrist | Laterality: Right

## 2014-08-11 MED ORDER — LIDOCAINE HCL (CARDIAC) 20 MG/ML IV SOLN
INTRAVENOUS | Status: DC | PRN
  Administered 2014-08-11: 100 mg via INTRAVENOUS

## 2014-08-11 MED ORDER — FENTANYL CITRATE 0.05 MG/ML IJ SOLN
INTRAMUSCULAR | Status: DC | PRN
  Administered 2014-08-11: 50 ug via INTRAVENOUS
  Administered 2014-08-11: 100 ug via INTRAVENOUS

## 2014-08-11 MED ORDER — SCOPOLAMINE 1 MG/3DAYS TD PT72
MEDICATED_PATCH | TRANSDERMAL | Status: DC | PRN
  Administered 2014-08-11: 1 via TRANSDERMAL

## 2014-08-11 MED ORDER — OXYCODONE HCL 5 MG PO TABS
ORAL_TABLET | ORAL | Status: AC
  Filled 2014-08-11: qty 1

## 2014-08-11 MED ORDER — PROPOFOL 10 MG/ML IV BOLUS
INTRAVENOUS | Status: DC | PRN
  Administered 2014-08-11: 200 mg via INTRAVENOUS

## 2014-08-11 MED ORDER — MIDAZOLAM HCL 5 MG/5ML IJ SOLN
INTRAMUSCULAR | Status: DC | PRN
  Administered 2014-08-11: 2 mg via INTRAVENOUS

## 2014-08-11 MED ORDER — ONDANSETRON HCL 4 MG/2ML IJ SOLN
4.0000 mg | Freq: Once | INTRAMUSCULAR | Status: AC | PRN
  Administered 2014-08-11: 4 mg via INTRAVENOUS

## 2014-08-11 MED ORDER — CEFAZOLIN SODIUM-DEXTROSE 2-3 GM-% IV SOLR
INTRAVENOUS | Status: AC
  Filled 2014-08-11: qty 50

## 2014-08-11 MED ORDER — SCOPOLAMINE 1 MG/3DAYS TD PT72
MEDICATED_PATCH | TRANSDERMAL | Status: AC
  Filled 2014-08-11: qty 1

## 2014-08-11 MED ORDER — OXYCODONE HCL 5 MG/5ML PO SOLN: 5.0000 mg | Freq: Once | ORAL | Status: DC | PRN

## 2014-08-11 MED ORDER — SCOPOLAMINE 1 MG/3DAYS TD PT72: 1.0000 | MEDICATED_PATCH | TRANSDERMAL | Status: DC

## 2014-08-11 MED ORDER — BUPIVACAINE HCL (PF) 0.25 % IJ SOLN
INTRAMUSCULAR | Status: AC
  Filled 2014-08-11: qty 30

## 2014-08-11 MED ORDER — LACTATED RINGERS IV SOLN
INTRAVENOUS | Status: DC
  Administered 2014-08-11 (×2): via INTRAVENOUS

## 2014-08-11 MED ORDER — HYDROMORPHONE HCL 1 MG/ML IJ SOLN
0.2500 mg | INTRAMUSCULAR | Status: DC | PRN
  Administered 2014-08-11 (×2): 0.5 mg via INTRAVENOUS

## 2014-08-11 MED ORDER — OXYCODONE HCL 5 MG PO TABS: 5.0000 mg | ORAL_TABLET | Freq: Once | ORAL | Status: DC | PRN

## 2014-08-11 MED ORDER — PROPOFOL 10 MG/ML IV EMUL
INTRAVENOUS | Status: AC
  Filled 2014-08-11: qty 50

## 2014-08-11 MED ORDER — ONDANSETRON HCL 4 MG/2ML IJ SOLN: 4.0000 mg | Freq: Once | INTRAMUSCULAR | Status: DC | PRN

## 2014-08-11 MED ORDER — OXYCODONE-ACETAMINOPHEN 5-325 MG PO TABS: 1.0000 | ORAL_TABLET | ORAL | Status: DC | PRN

## 2014-08-11 MED ORDER — FENTANYL CITRATE 0.05 MG/ML IJ SOLN
INTRAMUSCULAR | Status: AC
  Filled 2014-08-11: qty 6

## 2014-08-11 MED ORDER — HYDROMORPHONE HCL 1 MG/ML IJ SOLN
INTRAMUSCULAR | Status: AC
  Filled 2014-08-11: qty 1

## 2014-08-11 MED ORDER — BUPIVACAINE HCL (PF) 0.25 % IJ SOLN
INTRAMUSCULAR | Status: DC | PRN
  Administered 2014-08-11: 10 mL

## 2014-08-11 MED ORDER — ONDANSETRON HCL 4 MG/2ML IJ SOLN
INTRAMUSCULAR | Status: AC
  Filled 2014-08-11: qty 2

## 2014-08-11 MED ORDER — CEFAZOLIN SODIUM-DEXTROSE 2-3 GM-% IV SOLR
2.0000 g | INTRAVENOUS | Status: AC
  Administered 2014-08-11: 2 g via INTRAVENOUS

## 2014-08-11 MED ORDER — OXYCODONE HCL 5 MG PO TABS
5.0000 mg | ORAL_TABLET | Freq: Once | ORAL | Status: DC | PRN
Start: 2014-08-11 — End: 2014-08-11

## 2014-08-11 MED ORDER — CHLORHEXIDINE GLUCONATE 4 % EX LIQD: 60.0000 mL | Freq: Once | CUTANEOUS | Status: DC

## 2014-08-11 MED ORDER — DEXAMETHASONE SODIUM PHOSPHATE 10 MG/ML IJ SOLN
INTRAMUSCULAR | Status: DC | PRN
  Administered 2014-08-11: 8 mg via INTRAVENOUS

## 2014-08-11 MED ORDER — HYDROMORPHONE HCL 1 MG/ML IJ SOLN: 0.2500 mg | INTRAMUSCULAR | Status: DC | PRN

## 2014-08-11 MED ORDER — MIDAZOLAM HCL 2 MG/ML PO SYRP: 12.0000 mg | ORAL_SOLUTION | Freq: Once | ORAL | Status: DC | PRN

## 2014-08-11 MED ORDER — MIDAZOLAM HCL 2 MG/2ML IJ SOLN
INTRAMUSCULAR | Status: AC
  Filled 2014-08-11: qty 2

## 2014-08-11 MED ORDER — ONDANSETRON HCL 4 MG/2ML IJ SOLN
INTRAMUSCULAR | Status: DC | PRN
  Administered 2014-08-11: 4 mg via INTRAVENOUS

## 2014-08-11 MED ORDER — FENTANYL CITRATE 0.05 MG/ML IJ SOLN: 50.0000 ug | INTRAMUSCULAR | Status: DC | PRN

## 2014-08-11 MED ORDER — MIDAZOLAM HCL 2 MG/2ML IJ SOLN: 1.0000 mg | INTRAMUSCULAR | Status: DC | PRN

## 2014-08-11 SURGICAL SUPPLY — 70 items
APL SKNCLS STERI-STRIP NONHPOA (GAUZE/BANDAGES/DRESSINGS)
BANDAGE ELASTIC 3 VELCRO ST LF (GAUZE/BANDAGES/DRESSINGS) ×2 IMPLANT
BANDAGE ELASTIC 4 VELCRO ST LF (GAUZE/BANDAGES/DRESSINGS) ×2 IMPLANT
BENZOIN TINCTURE PRP APPL 2/3 (GAUZE/BANDAGES/DRESSINGS) IMPLANT
BLADE MINI RND TIP GREEN BEAV (BLADE) IMPLANT
BLADE SURG 15 STRL LF DISP TIS (BLADE) ×1 IMPLANT
BLADE SURG 15 STRL SS (BLADE) ×2
BNDG CMPR 9X4 STRL LF SNTH (GAUZE/BANDAGES/DRESSINGS)
BNDG CMPR MD 5X2 ELC HKLP STRL (GAUZE/BANDAGES/DRESSINGS)
BNDG ELASTIC 2 VLCR STRL LF (GAUZE/BANDAGES/DRESSINGS) IMPLANT
BNDG ESMARK 4X9 LF (GAUZE/BANDAGES/DRESSINGS) IMPLANT
BNDG GAUZE ELAST 4 BULKY (GAUZE/BANDAGES/DRESSINGS) ×2 IMPLANT
BRUSH SCRUB EZ PLAIN DRY (MISCELLANEOUS) IMPLANT
CORDS BIPOLAR (ELECTRODE) ×2 IMPLANT
COVER BACK TABLE 60X90IN (DRAPES) ×2 IMPLANT
CUFF TOURNIQUET SINGLE 18IN (TOURNIQUET CUFF) IMPLANT
DECANTER SPIKE VIAL GLASS SM (MISCELLANEOUS) IMPLANT
DRAPE EXTREMITY T 121X128X90 (DRAPE) ×2 IMPLANT
DRAPE OEC MINIVIEW 54X84 (DRAPES) IMPLANT
DRAPE SURG 17X23 STRL (DRAPES) ×2 IMPLANT
DURAPREP 26ML APPLICATOR (WOUND CARE) ×2 IMPLANT
GAUZE SPONGE 4X4 12PLY STRL (GAUZE/BANDAGES/DRESSINGS) ×2 IMPLANT
GAUZE SPONGE 4X4 16PLY XRAY LF (GAUZE/BANDAGES/DRESSINGS) IMPLANT
GAUZE XEROFORM 1X8 LF (GAUZE/BANDAGES/DRESSINGS) IMPLANT
GLOVE BIO SURGEON STRL SZ 6.5 (GLOVE) ×2 IMPLANT
GLOVE BIOGEL PI IND STRL 7.0 (GLOVE) ×1 IMPLANT
GLOVE BIOGEL PI INDICATOR 7.0 (GLOVE) ×1
GLOVE SURG SYN 8.0 (GLOVE) ×4 IMPLANT
GOWN STRL REUS W/ TWL LRG LVL3 (GOWN DISPOSABLE) ×1 IMPLANT
GOWN STRL REUS W/TWL LRG LVL3 (GOWN DISPOSABLE) ×2
GOWN STRL REUS W/TWL XL LVL3 (GOWN DISPOSABLE) ×2 IMPLANT
NEEDLE HYPO 25X1 1.5 SAFETY (NEEDLE) IMPLANT
NS IRRIG 1000ML POUR BTL (IV SOLUTION) ×2 IMPLANT
PACK BASIN DAY SURGERY FS (CUSTOM PROCEDURE TRAY) ×2 IMPLANT
PAD CAST 4YDX4 CTTN HI CHSV (CAST SUPPLIES) ×1 IMPLANT
PADDING CAST ABS 4INX4YD NS (CAST SUPPLIES) ×1
PADDING CAST ABS COTTON 4X4 ST (CAST SUPPLIES) ×1 IMPLANT
PADDING CAST COTTON 4X4 STRL (CAST SUPPLIES) ×2
PADDING UNDERCAST 2 STRL (CAST SUPPLIES)
PADDING UNDERCAST 2X4 STRL (CAST SUPPLIES) IMPLANT
SHEET MEDIUM DRAPE 40X70 STRL (DRAPES) ×2 IMPLANT
SLEEVE SCD COMPRESS KNEE MED (MISCELLANEOUS) IMPLANT
SPLINT PLASTER CAST XFAST 4X15 (CAST SUPPLIES) IMPLANT
SPLINT PLASTER XTRA FAST SET 4 (CAST SUPPLIES)
STOCKINETTE 4X48 STRL (DRAPES) ×2 IMPLANT
STRIP CLOSURE SKIN 1/2X4 (GAUZE/BANDAGES/DRESSINGS) IMPLANT
SUT ETHIBOND 3-0 V-5 (SUTURE) IMPLANT
SUT ETHILON 3 0 PS 1 (SUTURE) IMPLANT
SUT ETHILON 4 0 PS 2 18 (SUTURE) IMPLANT
SUT ETHILON 5 0 PS 2 18 (SUTURE) IMPLANT
SUT MON AB 4-0 PC3 18 (SUTURE) IMPLANT
SUT PROLENE 3 0 PS 2 (SUTURE) IMPLANT
SUT PROLENE 6 0 PC 1 (SUTURE) IMPLANT
SUT SILK 4 0 PS 2 (SUTURE) IMPLANT
SUT VIC AB 0 CT3 27 (SUTURE) IMPLANT
SUT VIC AB 0 SH 27 (SUTURE) IMPLANT
SUT VIC AB 2-0 SH 27 (SUTURE)
SUT VIC AB 2-0 SH 27XBRD (SUTURE) IMPLANT
SUT VIC AB 3-0 PS1 18 (SUTURE)
SUT VIC AB 3-0 PS1 18XBRD (SUTURE) IMPLANT
SUT VIC AB 4-0 P-3 18XBRD (SUTURE) IMPLANT
SUT VIC AB 4-0 P3 18 (SUTURE)
SUT VICRYL 4-0 PS2 18IN ABS (SUTURE) IMPLANT
SUT VICRYL RAPIDE 4-0 (SUTURE) IMPLANT
SUT VICRYL RAPIDE 4/0 PS 2 (SUTURE) IMPLANT
SYR BULB 3OZ (MISCELLANEOUS) ×2 IMPLANT
SYRINGE 10CC LL (SYRINGE) IMPLANT
TOWEL OR 17X24 6PK STRL BLUE (TOWEL DISPOSABLE) ×2 IMPLANT
TRAY DSU PREP LF (CUSTOM PROCEDURE TRAY) IMPLANT
UNDERPAD 30X30 INCONTINENT (UNDERPADS AND DIAPERS) ×2 IMPLANT

## 2014-08-11 NOTE — Anesthesia Postprocedure Evaluation (Signed)
  Anesthesia Post-op Note  Patient: Miranda [name]  Procedure(s) Performed: Procedure(s): RIGHT WRIST HARDWARE REMOVAL WITH TENOLYSIS AS NEEDED (Right) TENDON SHEATH RELEASE/TENOLYSIS (Right)  Patient Location: PACU  Anesthesia Type: General   Level of Consciousness: awake, alert  and oriented  Airway and Oxygen Therapy: Patient Spontanous Breathing  Post-op Pain: mild  Post-op Assessment: Post-op Vital signs reviewed  Post-op Vital Signs: Reviewed  Last Vitals:  Filed Vitals:   08/11/14 0945  BP: 128/76  Pulse: 75  Temp:   Resp: 13    Complications: No apparent anesthesia complications

## 2014-08-11 NOTE — Anesthesia Preprocedure Evaluation (Signed)
Anesthesia Evaluation  Patient identified by MRN, date of birth, ID band Patient awake    Reviewed: Allergy & Precautions, H&P , NPO status , Patient's Chart, lab work & pertinent test results  History of Anesthesia Complications (+) PONV  Airway Mallampati: I  TM Distance: >3 FB Neck ROM: Full    Dental  (+) Teeth Intact, Dental Advisory Given   Pulmonary  breath sounds clear to auscultation        Cardiovascular Rhythm:Regular Rate:Normal     Neuro/Psych    GI/Hepatic   Endo/Other    Renal/GU      Musculoskeletal   Abdominal   Peds  Hematology   Anesthesia Other Findings   Reproductive/Obstetrics                             Anesthesia Physical Anesthesia Plan  ASA: I  Anesthesia Plan: General   Post-op Pain Management:    Induction: Intravenous  Airway Management Planned: LMA  Additional Equipment:   Intra-op Plan:   Post-operative Plan: Extubation in OR  Informed Consent: I have reviewed the patients History and Physical, chart, labs and discussed the procedure including the risks, benefits and alternatives for the proposed anesthesia with the patient or authorized representative who has indicated his/her understanding and acceptance.   Dental advisory given  Plan Discussed with: CRNA, Anesthesiologist and Surgeon  Anesthesia Plan Comments:         Anesthesia Quick Evaluation

## 2014-08-11 NOTE — Op Note (Signed)
See note [phone]

## 2014-08-11 NOTE — Op Note (Signed)
[name], Miranda [name] NO.:  [id number]  MEDICAL RECORD NO.:  [id number]  LOCATION:                                 FACILITY:  PHYSICIAN:  Sheral Apley. Amando Chaput, M.D.DATE OF BIRTH:  June 30, 1986  DATE OF PROCEDURE:  08/11/2014 DATE OF DISCHARGE:  08/11/2014                              OPERATIVE REPORT   PREOPERATIVE DIAGNOSIS:  Status post right wrist arthrodesis with two 2.4-mm modular handset dorsal plates and bone grafting with pain and synovitis of the fourth and third dorsal compartments.  POSTOPERATIVE DIAGNOSIS:  Status post right wrist arthrodesis with two 2.4-mm modular handset dorsal plates and bone grafting with pain and synovitis of the fourth and third dorsal compartments.  PROCEDURE:  Hardware removal x2 through same incision, right wrist, as well as tenolysis and synovectomy of extensor pollicis longus and the extensor digitorum communis tendons.  SURGEON:  Sheral Apley. Burney Gauze, MD  ASSISTANT:  None.  ANESTHESIA:  General.  COMPLICATIONS:  No complication.  DRAINS:  No drains.  DESCRIPTION OF PROCEDURE:  The patient was taken to the operating suite. After induction of adequate general anesthetic, right upper extremity was prepped and draped in sterile fashion.  An Esmarch was used to exsanguinate the limb.  Tourniquet was then inflated to 250 mmHg.  At this point in time, using a previous incision which was slightly hypertrophic, I ellipsed out her hypertrophic scar sharply with 15 blade.  Dissection was carried down to the extensor pollicis longus tendon with significant swelling around the tendon.  I carefully freed this up using tenotomy incision, did a complete tenolysis of the EPL tendon and retracted radially.  The radial most plate was easily identified at the border of the second dorsal compartment.  A subperiosteal dissection was undertaken.  The plate was removed with no undue difficulty.  We carefully elevated the fourth dorsal  compartment off the ulnar plate without interrupting the retinaculum, retracted ulnarly, and then removed the ulnar side plate again with no undue difficulty.  The wound was irrigated.  At this point in time, we did a tenolysis and limited synovectomy of the extensor digitorum communis tendons, index, long, ring, and small finger DIP, EDQ as well without violating the undersurface of the flap.  We then thoroughly irrigated and loosely reapproximated the fourth dorsal compartment to the edge of the second dorsal compartment with 4-0 Vicryl.  We then closed with 4-0 Vicryl and a 3-0 Prolene subcuticular stitch.  Steri- Strips, 4x4s, fluffs, and a compressive dressing was applied as well as a volar splint.  The patient tolerated the procedures well, went to recovery room in stable fashion.     Sheral Apley Burney Gauze, M.D.     MAW/MEDQ  D:  08/11/2014  T:  08/11/2014  Job:  [id number]

## 2014-08-11 NOTE — Discharge Instructions (Signed)

## 2014-08-11 NOTE — Anesthesia Procedure Notes (Signed)
Procedure Name: LMA Insertion Date/Time: 08/11/2014 8:31 AM Performed by: Baxter Flattery Pre-anesthesia Checklist: Patient identified, Emergency Drugs available, Suction available and Patient being monitored Patient Re-evaluated:Patient Re-evaluated prior to inductionOxygen Delivery Method: Circle System Utilized Preoxygenation: Pre-oxygenation with 100% oxygen Intubation Type: IV induction Ventilation: Mask ventilation without difficulty LMA: LMA inserted LMA Size: 3.0 Number of attempts: 1 Airway Equipment and Method: bite block Placement Confirmation: positive ETCO2 and breath sounds checked- equal and bilateral Tube secured with: Tape Dental Injury: Teeth and Oropharynx as per pre-operative assessment

## 2014-08-11 NOTE — H&P (Signed)
Miranda [name] is an 28 y.o. female.   Chief Complaint: right wrist dorsal pain and swelling HPI: as above s/p right wrist fusion with painful dorsal hardware  Past Medical History  Diagnosis Date  . PONV (postoperative nausea and vomiting)   . Anemia   . Rheumatoid arthritis(714.0)   . History of asthma     as a child  . Painful orthopaedic hardware 07/2014    right wrist    Past Surgical History  Procedure Laterality Date  . Wisdom tooth extraction    . Laparoscopy  03/26/2012    Procedure: LAPAROSCOPY OPERATIVE;  Surgeon: Terrance Mass, MD;  Location: [facility];  Service: Gynecology;  Laterality: N/A;  Pelvic Washings  . Ovarian cyst removal  03/26/2012    Procedure: OVARIAN CYSTECTOMY;  Surgeon: Terrance Mass, MD;  Location: [facility];  Service: Gynecology;  Laterality: Bilateral;  . Dilation and curettage of uterus    . Wrist fusion  05/28/2012    Procedure: ARTHRODESIS WRIST;  Surgeon: Schuyler Amor, MD;  Location: [facility];  Service: Orthopedics;  Laterality: Right;  Right wrist fusion    Family History  Problem Relation Age of Onset  . Diabetes Father    Social History:  reports that she has never smoked. She has never used smokeless tobacco. She reports that she does not drink alcohol or use illicit drugs.  Allergies:  Allergies  Allergen Reactions  . Milk-Related Compounds Itching    Medications Prior to Admission  Medication Sig Dispense Refill  . calcium-vitamin D (OSCAL WITH D) 250-125 MG-UNIT per tablet Take 1 tablet by mouth daily.      . ferrous sulfate 325 (65 FE) MG tablet Take 325 mg by mouth daily with breakfast. Take 2 tabs in the morning    . ibuprofen (ADVIL,MOTRIN) 200 MG tablet Take 400-800 mg by mouth every 6 (six) hours as needed.    . Multiple Vitamins-Iron (MULTIVITAMINS WITH IRON) TABS Take 2 tablets by mouth daily.      No results found for this or any previous visit (from the past 48 hour(s)). No results  found.  Review of Systems  All other systems reviewed and are negative.   Blood pressure 122/79, pulse 78, temperature 98.4 F (36.9 C), temperature source Oral, resp. rate 16, height 5\' 4"  (1.626 m), weight 59.648 kg (131 lb 8 oz), last menstrual period 07/30/2014, SpO2 100 %. Physical Exam  Constitutional: She is oriented to person, place, and time. She appears well-developed and well-nourished.  HENT:  Head: Normocephalic and atraumatic.  Cardiovascular: Normal rate.   Respiratory: Effort normal.  Musculoskeletal:       Right wrist: She exhibits tenderness and swelling.  S/p right wrist fusion with painful hardware  Neurological: She is alert and oriented to person, place, and time.  Skin: Skin is warm.  Psychiatric: She has a normal mood and affect. Her behavior is normal. Judgment and thought content normal.     Assessment/Plan As above   Plan hardware removal and tenolysis as needed  Namita Yearwood A 08/11/2014, 8:08 AM

## 2014-08-11 NOTE — Transfer of Care (Signed)
Immediate Anesthesia Transfer of Care Note  Patient: Miranda [name]  Procedure(s) Performed: Procedure(s): RIGHT WRIST HARDWARE REMOVAL WITH TENOLYSIS AS NEEDED (Right) TENDON SHEATH RELEASE/TENOLYSIS (Right)  Patient Location: PACU  Anesthesia Type:General  Level of Consciousness: awake, alert  and patient cooperative  Airway & Oxygen Therapy: Patient Spontanous Breathing and Patient connected to face mask oxygen  Post-op Assessment: Report given to PACU RN, Post -op Vital signs reviewed and stable and Patient moving all extremities  Post vital signs: Reviewed and stable  Complications: No apparent anesthesia complications

## 2014-08-12 ENCOUNTER — Encounter (HOSPITAL_BASED_OUTPATIENT_CLINIC_OR_DEPARTMENT_OTHER): Payer: Self-pay | Admitting: Orthopedic Surgery

## 2015-05-03 ENCOUNTER — Encounter: Payer: Self-pay | Admitting: Gynecology

## 2015-05-03 ENCOUNTER — Other Ambulatory Visit (HOSPITAL_COMMUNITY)
Admission: RE | Admit: 2015-05-03 | Discharge: 2015-05-03 | Disposition: A | Discharge status: Home / Self Care | Payer: 59 | Source: Ambulatory Visit | Attending: Gynecology | Admitting: Gynecology

## 2015-05-03 ENCOUNTER — Ambulatory Visit (INDEPENDENT_AMBULATORY_CARE_PROVIDER_SITE_OTHER): Payer: 59 | Admitting: Gynecology

## 2015-05-03 VITALS — BP 118/76 | Ht 63.5 in | Wt 142.0 lb

## 2015-05-03 DIAGNOSIS — B373 Candidiasis of vulva and vagina: Secondary | ICD-10-CM

## 2015-05-03 DIAGNOSIS — Z01419 Encounter for gynecological examination (general) (routine) without abnormal findings: Secondary | ICD-10-CM

## 2015-05-03 DIAGNOSIS — Z1151 Encounter for screening for human papillomavirus (HPV): Secondary | ICD-10-CM | POA: Diagnosis present

## 2015-05-03 DIAGNOSIS — B3731 Acute candidiasis of vulva and vagina: Secondary | ICD-10-CM

## 2015-05-03 MED ORDER — TERCONAZOLE 0.4 % VA CREA: TOPICAL_CREAM | VAGINAL | Status: DC

## 2015-05-03 MED ORDER — FLUCONAZOLE 150 MG PO TABS: ORAL_TABLET | ORAL | Status: DC

## 2015-05-03 NOTE — Progress Notes (Signed)
Miranda [name] [birth year] [id number]   History:    29 y.o.  for annual gyn exam with complaint of recurrent yeast infection. She has been trying oral probiotic tablets with limited resolution of her symptoms. Patient does have history of rheumatoid arthritis and has been on steroids in the past and perhaps her immune system may be affected which sometimes contributes to her yeast infection. She is not sexually active she reports normal menstrual cycles.Review of patient's record  Indicated that in 2013 patient had a laparoscopic bilateral ovarian cystectomy resectoscopic polypectomy on July 3 as a result of bilateral ovarian cyst, menorrhagia, anemia and intrauterine polyps. Her pathology report demonstrated the following:  Diagnosis  1. Ovary, cyst, right  - HEMORRHAGIC CORPUS LUTEAL CYST, NO ATYPIA OR MALIGNANCY.  2. Ovary, cyst, left  - HEMORRHAGIC CORPUS LUTEAL CYST. NO ATYPIA OR MALIGNANCY.  3. Endometrial polyp  - BENIGN ENDOMETRIAL POLYP AND ADJACENT BENIGN SECRETORY ENDOMETRIUM, NO  ATYPIA, HYPERPLASIA OR MALIGNANCY.  - UNDERLYING UNREMARKABLE MYOMETRIUM.  - SMALL POLYPOID FRAGMENT OF BENIGN ENDOCERVICAL MUCOSA, NO DYSPLASIA OR  MALIGNANCY.  Patient reports no past history of any abnormal Pap smears.  Past medical history,surgical history, family history and social history were all reviewed and documented in the EPIC chart.  Gynecologic History Patient's last menstrual period was 04/09/2015. Contraception: none Last Pap: Several years ago. Results were: normal Last mammogram: Not indicated. Results were: Not indicated  Obstetric History OB History  Gravida Para Term Preterm AB SAB TAB Ectopic Multiple Living  0                  ROS: A ROS was performed and pertinent positives and negatives are included in the history.  GENERAL: No fevers or chills. HEENT: No change in vision, no earache, sore throat or sinus congestion. NECK: No pain or stiffness.  CARDIOVASCULAR: No chest pain or pressure. No palpitations. PULMONARY: No shortness of breath, cough or wheeze. GASTROINTESTINAL: No abdominal pain, nausea, vomiting or diarrhea, melena or bright red blood per rectum. GENITOURINARY: No urinary frequency, urgency, hesitancy or dysuria. MUSCULOSKELETAL: No joint or muscle pain, no back pain, no recent trauma. DERMATOLOGIC: No rash, no itching, no lesions. ENDOCRINE: No polyuria, polydipsia, no heat or cold intolerance. No recent change in weight. HEMATOLOGICAL: No anemia or easy bruising or bleeding. NEUROLOGIC: No headache, seizures, numbness, tingling or weakness. PSYCHIATRIC: No depression, no loss of interest in normal activity or change in sleep pattern.     Exam: chaperone present  BP 118/76 mmHg  Ht 5' 3.5" (1.613 m)  Wt 142 lb (64.411 kg)  BMI 24.76 kg/m2  LMP 04/09/2015  Body mass index is 24.76 kg/(m^2).  General appearance : Well developed well nourished female. No acute distress HEENT: Eyes: no retinal hemorrhage or exudates,  Neck supple, trachea midline, no carotid bruits, no thyroidmegaly Lungs: Clear to auscultation, no rhonchi or wheezes, or rib retractions  Heart: Regular rate and rhythm, no murmurs or gallops Breast:Examined in sitting and supine position were symmetrical in appearance, no palpable masses or tenderness,  no skin retraction, no nipple inversion, no nipple discharge, no skin discoloration, no axillary or supraclavicular lymphadenopathy Abdomen: no palpable masses or tenderness, no rebound or guarding Extremities: no edema or skin discoloration or tenderness  Pelvic:  Bartholin, Urethra, Skene Glands: Within normal limits             Vagina: No gross lesions or discharge  Cervix: No gross lesions or discharge  Uterus  anteverted, normal  size, shape and consistency, non-tender and mobile  Adnexa  Without masses or tenderness  Anus and perineum  normal   Rectovaginal  normal sphincter tone without palpated  masses or tenderness             Hemoccult not indicated     Assessment/Plan:  29 y.o. female for annual exam with history of recurrent yeast infection history of rheumatoid arthritis. Patient will be placed on a 6 months treatment to help with her symptoms of recurrent yeast infections as follows: She will apply Terazol vaginal cream daily at bedtime for 2 weeks and then begin taking Diflucan 150 mg one by mouth every weekly for 6 months. Pap smear with HPV screening was done today. Her PCP Dr. Scarlette Calico will be doing her blood work. We discussed importance of monthly breast exam.   Terrance Mass MD, 3:33 PM 05/03/2015

## 2015-05-05 LAB — CYTOLOGY - PAP

## 2015-05-16 ENCOUNTER — Ambulatory Visit (INDEPENDENT_AMBULATORY_CARE_PROVIDER_SITE_OTHER): Payer: 59 | Admitting: Internal Medicine

## 2015-05-16 ENCOUNTER — Encounter: Payer: Self-pay | Admitting: Internal Medicine

## 2015-05-16 ENCOUNTER — Other Ambulatory Visit (INDEPENDENT_AMBULATORY_CARE_PROVIDER_SITE_OTHER): Payer: 59

## 2015-05-16 VITALS — BP 110/74 | HR 98 | Temp 98.7°F | Resp 12 | Ht 64.0 in | Wt 140.0 lb

## 2015-05-16 DIAGNOSIS — D509 Iron deficiency anemia, unspecified: Secondary | ICD-10-CM

## 2015-05-16 DIAGNOSIS — Z0000 Encounter for general adult medical examination without abnormal findings: Secondary | ICD-10-CM

## 2015-05-16 DIAGNOSIS — M839 Adult osteomalacia, unspecified: Secondary | ICD-10-CM | POA: Diagnosis not present

## 2015-05-16 DIAGNOSIS — R739 Hyperglycemia, unspecified: Secondary | ICD-10-CM | POA: Diagnosis not present

## 2015-05-16 DIAGNOSIS — M8580 Other specified disorders of bone density and structure, unspecified site: Secondary | ICD-10-CM

## 2015-05-16 DIAGNOSIS — M859 Disorder of bone density and structure, unspecified: Secondary | ICD-10-CM | POA: Diagnosis not present

## 2015-05-16 DIAGNOSIS — T380X5A Adverse effect of glucocorticoids and synthetic analogues, initial encounter: Secondary | ICD-10-CM

## 2015-05-16 LAB — COMPREHENSIVE METABOLIC PANEL
ALK PHOS: 100 U/L (ref 39–117)
ALT: 9 U/L (ref 0–35)
AST: 17 U/L (ref 0–37)
Albumin: 4.1 g/dL (ref 3.5–5.2)
BUN: 14 mg/dL (ref 6–23)
CHLORIDE: 100 meq/L (ref 96–112)
CO2: 29 mEq/L (ref 19–32)
Calcium: 10 mg/dL (ref 8.4–10.5)
Creatinine, Ser: 0.61 mg/dL (ref 0.40–1.20)
GFR: 146.52 mL/min (ref >60.00)
GLUCOSE: 93 mg/dL (ref 70–99)
POTASSIUM: 4.1 meq/L (ref 3.5–5.1)
SODIUM: 136 meq/L (ref 135–145)
TOTAL PROTEIN: 9 g/dL — AB (ref 6.0–8.3)
Total Bilirubin: 0.3 mg/dL (ref 0.2–1.2)

## 2015-05-16 LAB — CBC WITH DIFFERENTIAL/PLATELET
Basophils Absolute: 0 10*3/uL (ref 0.0–0.1)
Basophils Relative: 0.4 % (ref 0.0–3.0)
EOS PCT: 0.9 % (ref 0.0–5.0)
Eosinophils Absolute: 0.1 10*3/uL (ref 0.0–0.7)
HCT: 32.3 % — ABNORMAL LOW (ref 36.0–46.0)
Hemoglobin: 10.3 g/dL — ABNORMAL LOW (ref 12.0–15.0)
LYMPHS ABS: 2.8 10*3/uL (ref 0.7–4.0)
Lymphocytes Relative: 33.8 % (ref 12.0–46.0)
MCHC: 32 g/dL (ref 30.0–36.0)
MCV: 84.2 fl (ref 78.0–100.0)
Monocytes Absolute: 0.5 10*3/uL (ref 0.1–1.0)
Monocytes Relative: 6.4 % (ref 3.0–12.0)
NEUTROS PCT: 58.5 % (ref 43.0–77.0)
Neutro Abs: 4.8 10*3/uL (ref 1.4–7.7)
Platelets: 403 10*3/uL — ABNORMAL HIGH (ref 150.0–400.0)
RBC: 3.84 Mil/uL — AB (ref 3.87–5.11)
RDW: 14.5 % (ref 11.5–15.5)
WBC: 8.1 10*3/uL (ref 4.0–10.5)

## 2015-05-16 LAB — LIPID PANEL
Cholesterol: 169 mg/dL (ref 0–200)
HDL: 41.8 mg/dL (ref >39.00)
LDL Cholesterol: 114 mg/dL — ABNORMAL HIGH (ref 0–99)
NonHDL: 127.6
TRIGLYCERIDES: 66 mg/dL (ref 0.0–149.0)
Total CHOL/HDL Ratio: 4
VLDL: 13.2 mg/dL (ref 0.0–40.0)

## 2015-05-16 LAB — IBC PANEL
IRON: 30 ug/dL — AB (ref 42–145)
SATURATION RATIOS: 8.6 % — AB (ref 20.0–50.0)
Transferrin: 250 mg/dL (ref 212.0–360.0)

## 2015-05-16 LAB — HEMOGLOBIN A1C: Hgb A1c MFr Bld: 5.8 % (ref 4.6–6.5)

## 2015-05-16 LAB — TSH: TSH: 0.55 u[IU]/mL (ref 0.35–4.50)

## 2015-05-16 LAB — VITAMIN D 25 HYDROXY (VIT D DEFICIENCY, FRACTURES): VITD: 41.2 ng/mL (ref 30.00–100.00)

## 2015-05-16 LAB — FERRITIN: FERRITIN: 346.5 ng/mL — AB (ref 10.0–291.0)

## 2015-05-16 MED ORDER — CHOLECALCIFEROL 50 MCG (2000 UT) PO TABS
1.0000 | ORAL_TABLET | Freq: Every day | ORAL | Status: DC
Start: 2015-05-16 — End: 2021-06-19

## 2015-05-16 NOTE — Progress Notes (Signed)
Pre visit review using our clinic review tool, if applicable. No additional management support is needed unless otherwise documented below in the visit note. 

## 2015-05-16 NOTE — Patient Instructions (Signed)
Preventive Care for Adults A healthy lifestyle and preventive care can promote health and wellness. Preventive health guidelines for women include the following key practices.  A routine yearly physical is a good way to check with your health care provider about your health and preventive screening. It is a chance to share any concerns and updates on your health and to receive a thorough exam.  Visit your dentist for a routine exam and preventive care every 6 months. Brush your teeth twice a day and floss once a day. Good oral hygiene prevents tooth decay and gum disease.  The frequency of eye exams is based on your age, health, family medical history, use of contact lenses, and other factors. Follow your health care provider's recommendations for frequency of eye exams.  Eat a healthy diet. Foods like vegetables, fruits, whole grains, low-fat dairy products, and lean protein foods contain the nutrients you need without too many calories. Decrease your intake of foods high in solid fats, added sugars, and salt. Eat the right amount of calories for you.Get information about a proper diet from your health care provider, if necessary.  Regular physical exercise is one of the most important things you can do for your health. Most adults should get at least 150 minutes of moderate-intensity exercise (any activity that increases your heart rate and causes you to sweat) each week. In addition, most adults need muscle-strengthening exercises on 2 or more days a week.  Maintain a healthy weight. The body mass index (BMI) is a screening tool to identify possible weight problems. It provides an estimate of body fat based on height and weight. Your health care provider can find your BMI and can help you achieve or maintain a healthy weight.For adults 20 years and older:  A BMI below 18.5 is considered underweight.  A BMI of 18.5 to 24.9 is normal.  A BMI of 25 to 29.9 is considered overweight.  A BMI of  30 and above is considered obese.  Maintain normal blood lipids and cholesterol levels by exercising and minimizing your intake of saturated fat. Eat a balanced diet with plenty of fruit and vegetables. Blood tests for lipids and cholesterol should begin at age 76 and be repeated every 5 years. If your lipid or cholesterol levels are high, you are over 50, or you are at high risk for heart disease, you may need your cholesterol levels checked more frequently.Ongoing high lipid and cholesterol levels should be treated with medicines if diet and exercise are not working.  If you smoke, find out from your health care provider how to quit. If you do not use tobacco, do not start.  Lung cancer screening is recommended for adults aged 22-80 years who are at high risk for developing lung cancer because of a history of smoking. A yearly low-dose CT scan of the lungs is recommended for people who have at least a 30-pack-year history of smoking and are a current smoker or have quit within the past 15 years. A pack year of smoking is smoking an average of 1 pack of cigarettes a day for 1 year (for example: 1 pack a day for 30 years or 2 packs a day for 15 years). Yearly screening should continue until the smoker has stopped smoking for at least 15 years. Yearly screening should be stopped for people who develop a health problem that would prevent them from having lung cancer treatment.  If you are pregnant, do not drink alcohol. If you are breastfeeding,  be very cautious about drinking alcohol. If you are not pregnant and choose to drink alcohol, do not have more than 1 drink per day. One drink is considered to be 12 ounces (355 mL) of beer, 5 ounces (148 mL) of wine, or 1.5 ounces (44 mL) of liquor.  Avoid use of street drugs. Do not share needles with anyone. Ask for help if you need support or instructions about stopping the use of drugs.  High blood pressure causes heart disease and increases the risk of  stroke. Your blood pressure should be checked at least every 1 to 2 years. Ongoing high blood pressure should be treated with medicines if weight loss and exercise do not work.  If you are 75-52 years old, ask your health care provider if you should take aspirin to prevent strokes.  Diabetes screening involves taking a blood sample to check your fasting blood sugar level. This should be done once every 3 years, after age 15, if you are within normal weight and without risk factors for diabetes. Testing should be considered at a younger age or be carried out more frequently if you are overweight and have at least 1 risk factor for diabetes.  Breast cancer screening is essential preventive care for women. You should practice "breast self-awareness." This means understanding the normal appearance and feel of your breasts and may include breast self-examination. Any changes detected, no matter how small, should be reported to a health care provider. Women in their 58s and 30s should have a clinical breast exam (CBE) by a health care provider as part of a regular health exam every 1 to 3 years. After age 16, women should have a CBE every year. Starting at age 53, women should consider having a mammogram (breast X-ray test) every year. Women who have a family history of breast cancer should talk to their health care provider about genetic screening. Women at a high risk of breast cancer should talk to their health care providers about having an MRI and a mammogram every year.  Breast cancer gene (BRCA)-related cancer risk assessment is recommended for women who have family members with BRCA-related cancers. BRCA-related cancers include breast, ovarian, tubal, and peritoneal cancers. Having family members with these cancers may be associated with an increased risk for harmful changes (mutations) in the breast cancer genes BRCA1 and BRCA2. Results of the assessment will determine the need for genetic counseling and  BRCA1 and BRCA2 testing.  Routine pelvic exams to screen for cancer are no longer recommended for nonpregnant women who are considered low risk for cancer of the pelvic organs (ovaries, uterus, and vagina) and who do not have symptoms. Ask your health care provider if a screening pelvic exam is right for you.  If you have had past treatment for cervical cancer or a condition that could lead to cancer, you need Pap tests and screening for cancer for at least 20 years after your treatment. If Pap tests have been discontinued, your risk factors (such as having a new sexual partner) need to be reassessed to determine if screening should be resumed. Some women have medical problems that increase the chance of getting cervical cancer. In these cases, your health care provider may recommend more frequent screening and Pap tests.  The HPV test is an additional test that may be used for cervical cancer screening. The HPV test looks for the virus that can cause the cell changes on the cervix. The cells collected during the Pap test can be  tested for HPV. The HPV test could be used to screen women aged 30 years and older, and should be used in women of any age who have unclear Pap test results. After the age of 30, women should have HPV testing at the same frequency as a Pap test.  Colorectal cancer can be detected and often prevented. Most routine colorectal cancer screening begins at the age of 50 years and continues through age 75 years. However, your health care provider may recommend screening at an earlier age if you have risk factors for colon cancer. On a yearly basis, your health care provider may provide home test kits to check for hidden blood in the stool. Use of a small camera at the end of a tube, to directly examine the colon (sigmoidoscopy or colonoscopy), can detect the earliest forms of colorectal cancer. Talk to your health care provider about this at age 50, when routine screening begins. Direct  exam of the colon should be repeated every 5-10 years through age 75 years, unless early forms of pre-cancerous polyps or small growths are found.  People who are at an increased risk for hepatitis B should be screened for this virus. You are considered at high risk for hepatitis B if:  You were born in a country where hepatitis B occurs often. Talk with your health care provider about which countries are considered high risk.  Your parents were born in a high-risk country and you have not received a shot to protect against hepatitis B (hepatitis B vaccine).  You have HIV or AIDS.  You use needles to inject street drugs.  You live with, or have sex with, someone who has hepatitis B.  You get hemodialysis treatment.  You take certain medicines for conditions like cancer, organ transplantation, and autoimmune conditions.  Hepatitis C blood testing is recommended for all people born from 1945 through 1965 and any individual with known risks for hepatitis C.  Practice safe sex. Use condoms and avoid high-risk sexual practices to reduce the spread of sexually transmitted infections (STIs). STIs include gonorrhea, chlamydia, syphilis, trichomonas, herpes, HPV, and human immunodeficiency virus (HIV). Herpes, HIV, and HPV are viral illnesses that have no cure. They can result in disability, cancer, and death.  You should be screened for sexually transmitted illnesses (STIs) including gonorrhea and chlamydia if:  You are sexually active and are younger than 24 years.  You are older than 24 years and your health care provider tells you that you are at risk for this type of infection.  Your sexual activity has changed since you were last screened and you are at an increased risk for chlamydia or gonorrhea. Ask your health care provider if you are at risk.  If you are at risk of being infected with HIV, it is recommended that you take a prescription medicine daily to prevent HIV infection. This is  called preexposure prophylaxis (PrEP). You are considered at risk if:  You are a heterosexual woman, are sexually active, and are at increased risk for HIV infection.  You take drugs by injection.  You are sexually active with a partner who has HIV.  Talk with your health care provider about whether you are at high risk of being infected with HIV. If you choose to begin PrEP, you should first be tested for HIV. You should then be tested every 3 months for as long as you are taking PrEP.  Osteoporosis is a disease in which the bones lose minerals and strength   with aging. This can result in serious bone fractures or breaks. The risk of osteoporosis can be identified using a bone density scan. Women ages 65 years and over and women at risk for fractures or osteoporosis should discuss screening with their health care providers. Ask your health care provider whether you should take a calcium supplement or vitamin D to reduce the rate of osteoporosis.  Menopause can be associated with physical symptoms and risks. Hormone replacement therapy is available to decrease symptoms and risks. You should talk to your health care provider about whether hormone replacement therapy is right for you.  Use sunscreen. Apply sunscreen liberally and repeatedly throughout the day. You should seek shade when your shadow is shorter than you. Protect yourself by wearing long sleeves, pants, a wide-brimmed hat, and sunglasses year round, whenever you are outdoors.  Once a month, do a whole body skin exam, using a mirror to look at the skin on your back. Tell your health care provider of new moles, moles that have irregular borders, moles that are larger than a pencil eraser, or moles that have changed in shape or color.  Stay current with required vaccines (immunizations).  Influenza vaccine. All adults should be immunized every year.  Tetanus, diphtheria, and acellular pertussis (Td, Tdap) vaccine. Pregnant women should  receive 1 dose of Tdap vaccine during each pregnancy. The dose should be obtained regardless of the length of time since the last dose. Immunization is preferred during the 27th-36th week of gestation. An adult who has not previously received Tdap or who does not know her vaccine status should receive 1 dose of Tdap. This initial dose should be followed by tetanus and diphtheria toxoids (Td) booster doses every 10 years. Adults with an unknown or incomplete history of completing a 3-dose immunization series with Td-containing vaccines should begin or complete a primary immunization series including a Tdap dose. Adults should receive a Td booster every 10 years.  Varicella vaccine. An adult without evidence of immunity to varicella should receive 2 doses or a second dose if she has previously received 1 dose. Pregnant females who do not have evidence of immunity should receive the first dose after pregnancy. This first dose should be obtained before leaving the health care facility. The second dose should be obtained 4-8 weeks after the first dose.  Human papillomavirus (HPV) vaccine. Females aged 13-26 years who have not received the vaccine previously should obtain the 3-dose series. The vaccine is not recommended for use in pregnant females. However, pregnancy testing is not needed before receiving a dose. If a female is found to be pregnant after receiving a dose, no treatment is needed. In that case, the remaining doses should be delayed until after the pregnancy. Immunization is recommended for any person with an immunocompromised condition through the age of 26 years if she did not get any or all doses earlier. During the 3-dose series, the second dose should be obtained 4-8 weeks after the first dose. The third dose should be obtained 24 weeks after the first dose and 16 weeks after the second dose.  Zoster vaccine. One dose is recommended for adults aged 60 years or older unless certain conditions are  present.  Measles, mumps, and rubella (MMR) vaccine. Adults born before 1957 generally are considered immune to measles and mumps. Adults born in 1957 or later should have 1 or more doses of MMR vaccine unless there is a contraindication to the vaccine or there is laboratory evidence of immunity to   each of the three diseases. A routine second dose of MMR vaccine should be obtained at least 28 days after the first dose for students attending postsecondary schools, health care workers, or international travelers. People who received inactivated measles vaccine or an unknown type of measles vaccine during 1963-1967 should receive 2 doses of MMR vaccine. People who received inactivated mumps vaccine or an unknown type of mumps vaccine before 1979 and are at high risk for mumps infection should consider immunization with 2 doses of MMR vaccine. For females of childbearing age, rubella immunity should be determined. If there is no evidence of immunity, females who are not pregnant should be vaccinated. If there is no evidence of immunity, females who are pregnant should delay immunization until after pregnancy. Unvaccinated health care workers born before 1957 who lack laboratory evidence of measles, mumps, or rubella immunity or laboratory confirmation of disease should consider measles and mumps immunization with 2 doses of MMR vaccine or rubella immunization with 1 dose of MMR vaccine.  Pneumococcal 13-valent conjugate (PCV13) vaccine. When indicated, a person who is uncertain of her immunization history and has no record of immunization should receive the PCV13 vaccine. An adult aged 19 years or older who has certain medical conditions and has not been previously immunized should receive 1 dose of PCV13 vaccine. This PCV13 should be followed with a dose of pneumococcal polysaccharide (PPSV23) vaccine. The PPSV23 vaccine dose should be obtained at least 8 weeks after the dose of PCV13 vaccine. An adult aged 19  years or older who has certain medical conditions and previously received 1 or more doses of PPSV23 vaccine should receive 1 dose of PCV13. The PCV13 vaccine dose should be obtained 1 or more years after the last PPSV23 vaccine dose.  Pneumococcal polysaccharide (PPSV23) vaccine. When PCV13 is also indicated, PCV13 should be obtained first. All adults aged 65 years and older should be immunized. An adult younger than age 65 years who has certain medical conditions should be immunized. Any person who resides in a nursing home or long-term care facility should be immunized. An adult smoker should be immunized. People with an immunocompromised condition and certain other conditions should receive both PCV13 and PPSV23 vaccines. People with human immunodeficiency virus (HIV) infection should be immunized as soon as possible after diagnosis. Immunization during chemotherapy or radiation therapy should be avoided. Routine use of PPSV23 vaccine is not recommended for American Indians, [place] Natives, or people younger than 65 years unless there are medical conditions that require PPSV23 vaccine. When indicated, people who have unknown immunization and have no record of immunization should receive PPSV23 vaccine. One-time revaccination 5 years after the first dose of PPSV23 is recommended for people aged 19-64 years who have chronic kidney failure, nephrotic syndrome, asplenia, or immunocompromised conditions. People who received 1-2 doses of PPSV23 before age 65 years should receive another dose of PPSV23 vaccine at age 65 years or later if at least 5 years have passed since the previous dose. Doses of PPSV23 are not needed for people immunized with PPSV23 at or after age 65 years.  Meningococcal vaccine. Adults with asplenia or persistent complement component deficiencies should receive 2 doses of quadrivalent meningococcal conjugate (MenACWY-D) vaccine. The doses should be obtained at least 2 months apart.  Microbiologists working with certain meningococcal bacteria, military recruits, people at risk during an outbreak, and people who travel to or live in countries with a high rate of meningitis should be immunized. A first-year college student up through age   21 years who is living in a residence hall should receive a dose if she did not receive a dose on or after her 16th birthday. Adults who have certain high-risk conditions should receive one or more doses of vaccine.  Hepatitis A vaccine. Adults who wish to be protected from this disease, have certain high-risk conditions, work with hepatitis A-infected animals, work in hepatitis A research labs, or travel to or work in countries with a high rate of hepatitis A should be immunized. Adults who were previously unvaccinated and who anticipate close contact with an international adoptee during the first 60 days after arrival in the [place] from a country with a high rate of hepatitis A should be immunized.  Hepatitis B vaccine. Adults who wish to be protected from this disease, have certain high-risk conditions, may be exposed to blood or other infectious body fluids, are household contacts or sex partners of hepatitis B positive people, are clients or workers in certain care facilities, or travel to or work in countries with a high rate of hepatitis B should be immunized.  Haemophilus influenzae type b (Hib) vaccine. A previously unvaccinated person with asplenia or sickle cell disease or having a scheduled splenectomy should receive 1 dose of Hib vaccine. Regardless of previous immunization, a recipient of a hematopoietic stem cell transplant should receive a 3-dose series 6-12 months after her successful transplant. Hib vaccine is not recommended for adults with HIV infection. Preventive Services / Frequency Ages 64 to 68 years  Blood pressure check.** / Every 1 to 2 years.  Lipid and cholesterol check.** / Every 5 years beginning at age  22.  Clinical breast exam.** / Every 3 years for women in their 88s and 53s.  BRCA-related cancer risk assessment.** / For women who have family members with a BRCA-related cancer (breast, ovarian, tubal, or peritoneal cancers).  Pap test.** / Every 2 years from ages 90 through 51. Every 3 years starting at age 21 through age 56 or 3 with a history of 3 consecutive normal Pap tests.  HPV screening.** / Every 3 years from ages 24 through ages 1 to 46 with a history of 3 consecutive normal Pap tests.  Hepatitis C blood test.** / For any individual with known risks for hepatitis C.  Skin self-exam. / Monthly.  Influenza vaccine. / Every year.  Tetanus, diphtheria, and acellular pertussis (Tdap, Td) vaccine.** / Consult your health care provider. Pregnant women should receive 1 dose of Tdap vaccine during each pregnancy. 1 dose of Td every 10 years.  Varicella vaccine.** / Consult your health care provider. Pregnant females who do not have evidence of immunity should receive the first dose after pregnancy.  HPV vaccine. / 3 doses over 6 months, if 72 and younger. The vaccine is not recommended for use in pregnant females. However, pregnancy testing is not needed before receiving a dose.  Measles, mumps, rubella (MMR) vaccine.** / You need at least 1 dose of MMR if you were born in 1957 or later. You may also need a 2nd dose. For females of childbearing age, rubella immunity should be determined. If there is no evidence of immunity, females who are not pregnant should be vaccinated. If there is no evidence of immunity, females who are pregnant should delay immunization until after pregnancy.  Pneumococcal 13-valent conjugate (PCV13) vaccine.** / Consult your health care provider.  Pneumococcal polysaccharide (PPSV23) vaccine.** / 1 to 2 doses if you smoke cigarettes or if you have certain conditions.  Meningococcal vaccine.** /  1 dose if you are age 19 to 21 years and a first-year college  student living in a residence hall, or have one of several medical conditions, you need to get vaccinated against meningococcal disease. You may also need additional booster doses.  Hepatitis A vaccine.** / Consult your health care provider.  Hepatitis B vaccine.** / Consult your health care provider.  Haemophilus influenzae type b (Hib) vaccine.** / Consult your health care provider. Ages 40 to 64 years  Blood pressure check.** / Every 1 to 2 years.  Lipid and cholesterol check.** / Every 5 years beginning at age 20 years.  Lung cancer screening. / Every year if you are aged 55-80 years and have a 30-pack-year history of smoking and currently smoke or have quit within the past 15 years. Yearly screening is stopped once you have quit smoking for at least 15 years or develop a health problem that would prevent you from having lung cancer treatment.  Clinical breast exam.** / Every year after age 40 years.  BRCA-related cancer risk assessment.** / For women who have family members with a BRCA-related cancer (breast, ovarian, tubal, or peritoneal cancers).  Mammogram.** / Every year beginning at age 40 years and continuing for as long as you are in good health. Consult with your health care provider.  Pap test.** / Every 3 years starting at age 30 years through age 65 or 70 years with a history of 3 consecutive normal Pap tests.  HPV screening.** / Every 3 years from ages 30 years through ages 65 to 70 years with a history of 3 consecutive normal Pap tests.  Fecal occult blood test (FOBT) of stool. / Every year beginning at age 50 years and continuing until age 75 years. You may not need to do this test if you get a colonoscopy every 10 years.  Flexible sigmoidoscopy or colonoscopy.** / Every 5 years for a flexible sigmoidoscopy or every 10 years for a colonoscopy beginning at age 50 years and continuing until age 75 years.  Hepatitis C blood test.** / For all people born from 1945 through  1965 and any individual with known risks for hepatitis C.  Skin self-exam. / Monthly.  Influenza vaccine. / Every year.  Tetanus, diphtheria, and acellular pertussis (Tdap/Td) vaccine.** / Consult your health care provider. Pregnant women should receive 1 dose of Tdap vaccine during each pregnancy. 1 dose of Td every 10 years.  Varicella vaccine.** / Consult your health care provider. Pregnant females who do not have evidence of immunity should receive the first dose after pregnancy.  Zoster vaccine.** / 1 dose for adults aged 60 years or older.  Measles, mumps, rubella (MMR) vaccine.** / You need at least 1 dose of MMR if you were born in 1957 or later. You may also need a 2nd dose. For females of childbearing age, rubella immunity should be determined. If there is no evidence of immunity, females who are not pregnant should be vaccinated. If there is no evidence of immunity, females who are pregnant should delay immunization until after pregnancy.  Pneumococcal 13-valent conjugate (PCV13) vaccine.** / Consult your health care provider.  Pneumococcal polysaccharide (PPSV23) vaccine.** / 1 to 2 doses if you smoke cigarettes or if you have certain conditions.  Meningococcal vaccine.** / Consult your health care provider.  Hepatitis A vaccine.** / Consult your health care provider.  Hepatitis B vaccine.** / Consult your health care provider.  Haemophilus influenzae type b (Hib) vaccine.** / Consult your health care provider. Ages 65   years and over  Blood pressure check.** / Every 1 to 2 years.  Lipid and cholesterol check.** / Every 5 years beginning at age 22 years.  Lung cancer screening. / Every year if you are aged 73-80 years and have a 30-pack-year history of smoking and currently smoke or have quit within the past 15 years. Yearly screening is stopped once you have quit smoking for at least 15 years or develop a health problem that would prevent you from having lung cancer  treatment.  Clinical breast exam.** / Every year after age 4 years.  BRCA-related cancer risk assessment.** / For women who have family members with a BRCA-related cancer (breast, ovarian, tubal, or peritoneal cancers).  Mammogram.** / Every year beginning at age 40 years and continuing for as long as you are in good health. Consult with your health care provider.  Pap test.** / Every 3 years starting at age 9 years through age 34 or 91 years with 3 consecutive normal Pap tests. Testing can be stopped between 65 and 70 years with 3 consecutive normal Pap tests and no abnormal Pap or HPV tests in the past 10 years.  HPV screening.** / Every 3 years from ages 57 years through ages 64 or 45 years with a history of 3 consecutive normal Pap tests. Testing can be stopped between 65 and 70 years with 3 consecutive normal Pap tests and no abnormal Pap or HPV tests in the past 10 years.  Fecal occult blood test (FOBT) of stool. / Every year beginning at age 15 years and continuing until age 17 years. You may not need to do this test if you get a colonoscopy every 10 years.  Flexible sigmoidoscopy or colonoscopy.** / Every 5 years for a flexible sigmoidoscopy or every 10 years for a colonoscopy beginning at age 86 years and continuing until age 71 years.  Hepatitis C blood test.** / For all people born from [birth year] through 1967 and any individual with known risks for hepatitis C.  Osteoporosis screening.** / A one-time screening for women ages 83 years and over and women at risk for fractures or osteoporosis.  Skin self-exam. / Monthly.  Influenza vaccine. / Every year.  Tetanus, diphtheria, and acellular pertussis (Tdap/Td) vaccine.** / 1 dose of Td every 10 years.  Varicella vaccine.** / Consult your health care provider.  Zoster vaccine.** / 1 dose for adults aged 61 years or older.  Pneumococcal 13-valent conjugate (PCV13) vaccine.** / Consult your health care provider.  Pneumococcal  polysaccharide (PPSV23) vaccine.** / 1 dose for all adults aged 28 years and older.  Meningococcal vaccine.** / Consult your health care provider.  Hepatitis A vaccine.** / Consult your health care provider.  Hepatitis B vaccine.** / Consult your health care provider.  Haemophilus influenzae type b (Hib) vaccine.** / Consult your health care provider. ** Family history and personal history of risk and conditions may change your health care provider's recommendations. Document Released: 11/06/2001 Document Revised: 01/25/2014 Document Reviewed: 02/05/2011 [facility] Patient Information 2015 [place], [place]. This information is not intended to replace advice given to you by your health care provider. Make sure you discuss any questions you have with your health care provider.

## 2015-05-17 ENCOUNTER — Encounter: Payer: Self-pay | Admitting: Internal Medicine

## 2015-05-17 MED ORDER — FERROUS SULFATE 325 (65 FE) MG PO TABS: 325.0000 mg | ORAL_TABLET | Freq: Three times a day (TID) | ORAL | Status: DC

## 2015-05-17 NOTE — Progress Notes (Signed)
Subjective:  Patient ID: Miranda [name], female    DOB: [birth year]  Age: 29 y.o. MRN: [id number]  CC: Anemia and Annual Exam   HPI Miranda [name] presents for a complete physical, follow-up on anemia, and follow-up on osteoporosis. She has persistent joint pain that is less prominent this year than it was a year ago. She gets symptom relief with Advil.  Outpatient Prescriptions Prior to Visit  Medication Sig Dispense Refill  . calcium-vitamin D (OSCAL WITH D) 250-125 MG-UNIT per tablet Take 1 tablet by mouth daily.      . fluconazole (DIFLUCAN) 150 MG tablet One PO q weekly for 6 months 4 tablet 5  . ibuprofen (ADVIL,MOTRIN) 200 MG tablet Take 400-800 mg by mouth every 6 (six) hours as needed.    . Multiple Vitamins-Iron (MULTIVITAMINS WITH IRON) TABS Take 1 tablet by mouth daily.     . ferrous sulfate 325 (65 FE) MG tablet Take 325 mg by mouth daily with breakfast. Take 2 tabs in the morning    . oxyCODONE-acetaminophen (ROXICET) 5-325 MG per tablet Take 1 tablet by mouth every 4 (four) hours as needed for severe pain. 30 tablet 0  . terconazole (TERAZOL 7) 0.4 % vaginal cream Apply q hs x 2 weeks 45 g 0   No facility-administered medications prior to visit.    ROS Review of Systems  Constitutional: Negative.  Negative for fever, chills, diaphoresis, appetite change and fatigue.  HENT: Negative.   Eyes: Negative.   Respiratory: Negative.  Negative for cough, choking, chest tightness, shortness of breath, wheezing and stridor.   Cardiovascular: Negative.  Negative for chest pain, palpitations and leg swelling.  Gastrointestinal: Positive for abdominal pain. Negative for nausea, vomiting, diarrhea, constipation, blood in stool and anal bleeding.  Endocrine: Negative.   Genitourinary: Positive for menstrual problem. Negative for hematuria, flank pain, decreased urine volume, vaginal bleeding, vaginal discharge, difficulty urinating and vaginal pain.       She has heavy periods    Musculoskeletal: Positive for arthralgias. Negative for myalgias, back pain, joint swelling, gait problem, neck pain and neck stiffness.  Skin: Negative.  Negative for color change and pallor.  Allergic/Immunologic: Negative.   Neurological: Negative.  Negative for dizziness, tremors, weakness, light-headedness and numbness.  Hematological: Negative.  Negative for adenopathy. Does not bruise/bleed easily.  Psychiatric/Behavioral: Negative.     Objective:  BP 110/74 mmHg  Pulse 98  Temp(Src) 98.7 F (37.1 C) (Oral)  Resp 12  Ht 5\' 4"  (1.626 m)  Wt 140 lb (63.504 kg)  BMI 24.02 kg/m2  SpO2 99%  LMP 05/11/2015  BP Readings from Last 3 Encounters:  05/16/15 110/74  05/03/15 118/76  08/11/14 116/78    Wt Readings from Last 3 Encounters:  05/16/15 140 lb (63.504 kg)  05/03/15 142 lb (64.411 kg)  08/11/14 131 lb 8 oz (59.648 kg)    Physical Exam  Constitutional: She is oriented to person, place, and time. She appears well-developed and well-nourished. No distress.  HENT:  Head: Normocephalic and atraumatic.  Mouth/Throat: Oropharynx is clear and moist. No oropharyngeal exudate.  Eyes: Conjunctivae are normal. Right eye exhibits no discharge. Left eye exhibits no discharge. No scleral icterus.  Neck: Normal range of motion. Neck supple. No JVD present. No tracheal deviation present. No thyromegaly present.  Cardiovascular: Normal rate, regular rhythm, normal heart sounds and intact distal pulses.  Exam reveals no gallop and no friction rub.   No murmur heard. Pulmonary/Chest: Effort normal and breath sounds normal.  No stridor. No respiratory distress. She has no wheezes. She has no rales. She exhibits no tenderness.  Abdominal: Soft. Bowel sounds are normal. She exhibits no distension and no mass. There is no tenderness. There is no rebound and no guarding.  Musculoskeletal: Normal range of motion. She exhibits no edema or tenderness.  Lymphadenopathy:    She has no cervical  adenopathy.  Neurological: She is oriented to person, place, and time.  Skin: Skin is warm and dry. No rash noted. She is not diaphoretic. No erythema. No pallor.  Psychiatric: She has a normal mood and affect. Her behavior is normal. Judgment and thought content normal.  Vitals reviewed.   Lab Results  Component Value Date   WBC 8.1 05/16/2015   HGB 10.3* 05/16/2015   HCT 32.3* 05/16/2015   PLT 403.0* 05/16/2015   GLUCOSE 93 05/16/2015   CHOL 169 05/16/2015   TRIG 66.0 05/16/2015   HDL 41.80 05/16/2015   LDLCALC 114* 05/16/2015   ALT 9 05/16/2015   AST 17 05/16/2015   NA 136 05/16/2015   K 4.1 05/16/2015   CL 100 05/16/2015   CREATININE 0.61 05/16/2015   BUN 14 05/16/2015   CO2 29 05/16/2015   TSH 0.55 05/16/2015   HGBA1C 5.8 05/16/2015    No results found.  Assessment & Plan:   Miranda [name] was seen today for anemia and annual exam.  Diagnoses and all orders for this visit:  Vitamin D deficient osteomalacia- her vitamin D level is in the low 40s, I think it would benefit her to have a vitamin D level in the 50-70 range, so I have asked her to start 2000 international units a day of cholecalciferol -     Vit D  25 hydroxy (rtn osteoporosis monitoring); Future -     DG Bone Density; Future -     Cholecalciferol 2000 UNITS TABS; Take 1 tablet (2,000 Units total) by mouth daily.  Steroid-induced osteopenia- she is due for a follow-up DEXA scan -     DG Bone Density; Future -     Cholecalciferol 2000 UNITS TABS; Take 1 tablet (2,000 Units total) by mouth daily.  Iron deficiency anemia- this is caused by her heavy periods, her anemia has not improved and her iron level is low, I have asked her to increase her ferrous sulfate supplement from twice a day to 3 times a day -     IBC panel; Future -     Ferritin; Future -     ferrous sulfate 325 (65 FE) MG tablet; Take 1 tablet (325 mg total) by mouth 3 (three) times daily with meals. Take 2 tabs in the morning  Hyperglycemia-  she has prediabetes and will continue to work on her lifestyle modifications -     Hemoglobin A1c; Future  Routine general medical examination at a health care facility- she recently saw her gynecologist and her Pap smear is up-to-date. Exam done today. Labs ordered and reviewed. Vaccines were reviewed. She was given patient education material. -     Lipid panel; Future -     CBC with Differential/Platelet; Future -     Comprehensive metabolic panel; Future -     TSH; Future   I have discontinued Ms. Feeley's oxyCODONE-acetaminophen and terconazole. I have also changed her ferrous sulfate. Additionally, I am having her start on Cholecalciferol. Lastly, I am having her maintain her calcium-vitamin D, multivitamins with iron, ibuprofen, and fluconazole.  Meds ordered this encounter  Medications  . Cholecalciferol  2000 UNITS TABS    Sig: Take 1 tablet (2,000 Units total) by mouth daily.    Dispense:  90 tablet    Refill:  3  . ferrous sulfate 325 (65 FE) MG tablet    Sig: Take 1 tablet (325 mg total) by mouth 3 (three) times daily with meals. Take 2 tabs in the morning    Dispense:  90 tablet    Refill:  11     Follow-up: Return in about 6 months (around 11/16/2015).  Scarlette Calico, MD

## 2015-05-18 ENCOUNTER — Telehealth: Payer: Self-pay | Admitting: *Deleted

## 2015-05-18 MED ORDER — FERROUS SULFATE 325 (65 FE) MG PO TABS: 325.0000 mg | ORAL_TABLET | Freq: Three times a day (TID) | ORAL | Status: DC

## 2015-05-18 NOTE — Telephone Encounter (Signed)
Left msg on triage stating receive electronic script from md for ferrous sulfate, but needing to clarify directions have two different sig on script. Called pharmacy spoke with Bryan/pharmacist inform him it should be take 1 three times a day. Updated med list.../lmb

## 2015-07-08 ENCOUNTER — Ambulatory Visit (INDEPENDENT_AMBULATORY_CARE_PROVIDER_SITE_OTHER): Payer: 59

## 2015-07-08 DIAGNOSIS — Z23 Encounter for immunization: Secondary | ICD-10-CM

## 2015-08-16 ENCOUNTER — Encounter: Payer: Self-pay | Admitting: Internal Medicine

## 2015-08-22 ENCOUNTER — Ambulatory Visit (INDEPENDENT_AMBULATORY_CARE_PROVIDER_SITE_OTHER)
Admission: RE | Admit: 2015-08-22 | Discharge: 2015-08-22 | Disposition: A | Discharge status: Home / Self Care | Payer: 59 | Source: Ambulatory Visit | Attending: Internal Medicine | Admitting: Internal Medicine

## 2015-08-22 DIAGNOSIS — M839 Adult osteomalacia, unspecified: Secondary | ICD-10-CM | POA: Diagnosis not present

## 2015-08-22 DIAGNOSIS — T380X5A Adverse effect of glucocorticoids and synthetic analogues, initial encounter: Secondary | ICD-10-CM

## 2015-08-22 DIAGNOSIS — M859 Disorder of bone density and structure, unspecified: Secondary | ICD-10-CM

## 2015-08-22 DIAGNOSIS — M8580 Other specified disorders of bone density and structure, unspecified site: Secondary | ICD-10-CM

## 2015-08-29 ENCOUNTER — Encounter: Payer: Self-pay | Admitting: Internal Medicine

## 2015-08-29 ENCOUNTER — Other Ambulatory Visit: Payer: Self-pay | Admitting: Internal Medicine

## 2015-08-29 DIAGNOSIS — M8580 Other specified disorders of bone density and structure, unspecified site: Secondary | ICD-10-CM

## 2015-08-29 DIAGNOSIS — R739 Hyperglycemia, unspecified: Secondary | ICD-10-CM

## 2015-08-29 DIAGNOSIS — T380X5A Adverse effect of glucocorticoids and synthetic analogues, initial encounter: Secondary | ICD-10-CM

## 2015-08-29 LAB — HM DEXA SCAN: HM Dexa Scan: -2.4

## 2015-08-29 NOTE — Addendum Note (Signed)
Addended by: Janith Lima on: 08/29/2015 07:45 AM   Modules accepted: Miquel Dunn

## 2015-09-29 MED FILL — FLUCONAZOLE 150 MG TABLET: 150 | 28 days supply | Qty: 4 | Fill #5

## 2015-10-10 ENCOUNTER — Ambulatory Visit: Payer: 59 | Admitting: Endocrinology

## 2015-11-07 ENCOUNTER — Encounter: Payer: Self-pay | Admitting: Endocrinology

## 2015-11-07 ENCOUNTER — Ambulatory Visit (INDEPENDENT_AMBULATORY_CARE_PROVIDER_SITE_OTHER): Payer: 59 | Admitting: Endocrinology

## 2015-11-07 VITALS — BP 118/78 | HR 84 | Temp 98.9°F | Ht 64.0 in | Wt 139.0 lb

## 2015-11-07 DIAGNOSIS — M859 Disorder of bone density and structure, unspecified: Secondary | ICD-10-CM | POA: Diagnosis not present

## 2015-11-07 DIAGNOSIS — M8580 Other specified disorders of bone density and structure, unspecified site: Secondary | ICD-10-CM

## 2015-11-07 DIAGNOSIS — T380X5A Adverse effect of glucocorticoids and synthetic analogues, initial encounter: Principal | ICD-10-CM

## 2015-11-07 LAB — BASIC METABOLIC PANEL
BUN: 14 mg/dL (ref 6–23)
CHLORIDE: 102 meq/L (ref 96–112)
CO2: 28 meq/L (ref 19–32)
CREATININE: 0.76 mg/dL (ref 0.40–1.20)
Calcium: 9.4 mg/dL (ref 8.4–10.5)
GFR: 113.34 mL/min (ref >60.00)
Glucose, Bld: 96 mg/dL (ref 70–99)
POTASSIUM: 3.7 meq/L (ref 3.5–5.1)
Sodium: 137 mEq/L (ref 135–145)

## 2015-11-07 NOTE — Patient Instructions (Signed)
blood tests are requested for you today.  We'll let you know about the results. Please have an infusion here in then office for osteoporosis.  you will receive a phone call, about a day and time for an appointment.

## 2015-11-07 NOTE — Progress Notes (Signed)
Subjective:    Patient ID: Miranda [name], female    DOB: 1985-11-07, 30 y.o.   MRN: [id number]  HPI Pt was noted to have osteoporosis in 2011.  She was intermittently on prednisone for RA from approx 2007-2015.  She has never been on medication for osteoporosis.  she has never had bony fracture.  She has no history of any of the following: cancer, renal dz, thyroid problems, prolonged bedrest, alcoholism, urolithiasis, smoking, liver dz, primary hyperparathyroidism.  She does not take heparin or anticonvulsants.  She has reg menses.  She says she is not at risk for pregnancy now.  She was rx'ed low-dose ergocalciferol for a low-normal vit-D level.  She has moderate arthralgias, worst at the knees and hands, and assoc joint warmth.   Past Medical History  Diagnosis Date  . PONV (postoperative nausea and vomiting)   . Anemia   . Rheumatoid arthritis(714.0)   . History of asthma     as a child  . Painful orthopaedic hardware 07/2014    right wrist    Past Surgical History  Procedure Laterality Date  . Wisdom tooth extraction    . Laparoscopy  03/26/2012    Procedure: LAPAROSCOPY OPERATIVE;  Surgeon: Terrance Mass, MD;  Location: [facility];  Service: Gynecology;  Laterality: N/A;  Pelvic Washings  . Ovarian cyst removal  03/26/2012    Procedure: OVARIAN CYSTECTOMY;  Surgeon: Terrance Mass, MD;  Location: [facility];  Service: Gynecology;  Laterality: Bilateral;  . Dilation and curettage of uterus    . Wrist fusion  05/28/2012    Procedure: ARTHRODESIS WRIST;  Surgeon: Schuyler Amor, MD;  Location: [facility];  Service: Orthopedics;  Laterality: Right;  Right wrist fusion  . Hardware removal Right 08/11/2014    Procedure: RIGHT WRIST HARDWARE REMOVAL WITH TENOLYSIS AS NEEDED;  Surgeon: Charlotte Crumb, MD;  Location: [facility];  Service: Orthopedics;  Laterality: Right;  . Tenolysis Right 08/11/2014    Procedure: TENDON SHEATH RELEASE/TENOLYSIS;  Surgeon:  Charlotte Crumb, MD;  Location: [facility];  Service: Orthopedics;  Laterality: Right;    Social History   Social History  . Marital Status: Single    Spouse Name: N/A  . Number of Children: 0  . Years of Education: BA   Occupational History  .  [employer]    medical record.    Social History Main Topics  . Smoking status: Never Smoker   . Smokeless tobacco: Never Used  . Alcohol Use: No  . Drug Use: No  . Sexual Activity: No   Other Topics Concern  . Not on file   Social History Narrative   Pt lives at home alone.   Caffeine Use: none    Current Outpatient Prescriptions on File Prior to Visit  Medication Sig Dispense Refill  . calcium-vitamin D (OSCAL WITH D) 250-125 MG-UNIT per tablet Take 1 tablet by mouth daily.      . Cholecalciferol 2000 UNITS TABS Take 1 tablet (2,000 Units total) by mouth daily. 90 tablet 3  . ferrous sulfate 325 (65 FE) MG tablet Take 1 tablet (325 mg total) by mouth 3 (three) times daily with meals. 90 tablet 11  . fluconazole (DIFLUCAN) 150 MG tablet One PO q weekly for 6 months 4 tablet 5  . ibuprofen (ADVIL,MOTRIN) 200 MG tablet Take 400-800 mg by mouth every 6 (six) hours as needed.    . Multiple Vitamins-Iron (MULTIVITAMINS WITH IRON) TABS Take  1 tablet by mouth daily.      No current facility-administered medications on file prior to visit.    Allergies  Allergen Reactions  . Milk-Related Compounds Itching    Family History  Problem Relation Age of Onset  . Diabetes Father   . Osteoporosis Maternal Grandmother     BP 118/78 mmHg  Pulse 84  Temp(Src) 98.9 F (37.2 C) (Oral)  Ht 5\' 4"  (1.626 m)  Wt 139 lb (63.05 kg)  BMI 23.85 kg/m2  SpO2 96%  Review of Systems denies weight loss, hematuria, n/v, edema, skin rash, insomnia, falls, cramps, easy bruising, and rhinorrhea.  She has cold intolerance, low-back pain, and intermittent difficulty with concentration.      Objective:   Physical Exam VS: see vs  page GEN: no distress HEAD: head: no deformity eyes: no periorbital swelling, no proptosis external nose and ears are normal mouth: no lesion seen NECK: supple, thyroid is not enlarged CHEST WALL: no deformity LUNGS:  Clear to auscultation CV: reg rate and rhythm, no murmur ABD: abdomen is soft, nontender.  no hepatosplenomegaly.  not distended.  no hernia.   MUSCULOSKELETAL: muscle bulk and strength are grossly normal.  no obvious joint swelling.  gait is normal and steady.  Old healed surgical scar at the right wrist EXTEMITIES: no deformity, except the right wrist is fused.  no edema.  PULSES: no carotid bruit NEURO:  cn 2-12 grossly intact.   readily moves all 4's.  sensation is intact to touch on all 4's.  SKIN:  Normal texture and temperature.  No rash or suspicious lesion is visible.   NODES:  None palpable at the neck.   PSYCH: alert, well-oriented.  Does not appear anxious nor depressed.    DEXA: lowest Z-score is -2.4  CXR: no fxs are noted   Lab Results  Component Value Date   PTH 44 11/07/2015   CALCIUM 9.4 11/07/2015   CALCIUM 9.4 11/07/2015   CAION 1.14 11/30/2011   Lab Results  Component Value Date   CREATININE 0.76 11/07/2015   BUN 14 11/07/2015   NA 137 11/07/2015   K 3.7 11/07/2015   CL 102 11/07/2015   CO2 28 11/07/2015   25-OH vit-D=41  I have reviewed outside records, and summarized: Pt was noted to have abnormal DEXA, and referred here.     Assessment & Plan:  Osteopenia: very close to osteoporosis, so I advised rx.  She declines oral contraceptive rx.   RA: this will continue to be a risk factor for osteoporosis for her, even if she does not take any further steroid rx.   Patient is advised the following: Patient Instructions  blood tests are requested for you today.  We'll let you know about the results. Please have an infusion here in then office for osteoporosis.  you will receive a phone call, about a day and time for an appointment.

## 2015-11-08 LAB — PTH, INTACT AND CALCIUM
CALCIUM: 9.4 mg/dL (ref 8.4–10.5)
PTH: 44 pg/mL (ref 14–64)

## 2015-11-09 ENCOUNTER — Telehealth: Payer: Self-pay | Admitting: Endocrinology

## 2015-11-09 MED ORDER — RISEDRONATE SODIUM 35 MG PO TABS: 35.0000 mg | ORAL_TABLET | ORAL | Status: DC

## 2015-11-09 MED FILL — RISEDRONATE NA 35 MG TAB: 35 | 28 days supply | Qty: 4 | Fill #0

## 2015-11-09 NOTE — Telephone Encounter (Signed)
please cancel the reclast infusion please call patient: We have discussed among the doctors, and we feel it is better to take then actonel pill. i have sent a prescription to your pharmacy Please return in 1 year. This is because it does not last as long in your body--we can stop it for any future pregnancy.

## 2015-11-09 NOTE — Telephone Encounter (Signed)
Pt advised of note below and voiced understanding.  

## 2015-12-12 MED FILL — RISEDRONATE NA 35 MG TAB: 35 | 84 days supply | Qty: 12 | Fill #1

## 2016-02-02 ENCOUNTER — Encounter: Payer: Self-pay | Admitting: Internal Medicine

## 2016-02-05 ENCOUNTER — Other Ambulatory Visit: Payer: Self-pay | Admitting: Internal Medicine

## 2016-02-05 DIAGNOSIS — M069 Rheumatoid arthritis, unspecified: Secondary | ICD-10-CM

## 2016-02-09 ENCOUNTER — Other Ambulatory Visit: Payer: Self-pay | Admitting: *Deleted

## 2016-02-09 NOTE — Patient Outreach (Signed)
Called [facility] in response to e-mail she sent [facility] care management assistant [facility] on 02/03/16. Message left on Suzzette's cell phone requesting she return call to this RNCM.  Barrington Ellison RN,CCM,CDE [facility] Management Coordinator [facility] Office Phone [phone] Office Fax [phone]

## 2016-03-05 DIAGNOSIS — M79642 Pain in left hand: Secondary | ICD-10-CM | POA: Diagnosis not present

## 2016-03-05 DIAGNOSIS — M0579 Rheumatoid arthritis with rheumatoid factor of multiple sites without organ or systems involvement: Secondary | ICD-10-CM | POA: Diagnosis not present

## 2016-03-05 DIAGNOSIS — M2550 Pain in unspecified joint: Secondary | ICD-10-CM | POA: Diagnosis not present

## 2016-03-05 DIAGNOSIS — R5382 Chronic fatigue, unspecified: Secondary | ICD-10-CM | POA: Diagnosis not present

## 2016-03-05 DIAGNOSIS — M25562 Pain in left knee: Secondary | ICD-10-CM | POA: Diagnosis not present

## 2016-03-05 DIAGNOSIS — M7989 Other specified soft tissue disorders: Secondary | ICD-10-CM | POA: Diagnosis not present

## 2016-03-05 DIAGNOSIS — M25561 Pain in right knee: Secondary | ICD-10-CM | POA: Diagnosis not present

## 2016-03-05 DIAGNOSIS — M79641 Pain in right hand: Secondary | ICD-10-CM | POA: Diagnosis not present

## 2016-05-10 MED FILL — RISEDRONATE NA 35 MG TAB: 35 | 84 days supply | Qty: 12 | Fill #2

## 2016-06-04 ENCOUNTER — Encounter: Payer: 59 | Admitting: Internal Medicine

## 2016-06-13 LAB — HM PAP SMEAR

## 2016-06-18 ENCOUNTER — Encounter: Payer: Self-pay | Admitting: Gynecology

## 2016-06-18 ENCOUNTER — Ambulatory Visit (INDEPENDENT_AMBULATORY_CARE_PROVIDER_SITE_OTHER): Payer: 59 | Admitting: Gynecology

## 2016-06-18 VITALS — BP 122/78 | Ht 64.0 in | Wt 135.0 lb

## 2016-06-18 DIAGNOSIS — Z01419 Encounter for gynecological examination (general) (routine) without abnormal findings: Secondary | ICD-10-CM

## 2016-06-18 DIAGNOSIS — M859 Disorder of bone density and structure, unspecified: Secondary | ICD-10-CM | POA: Diagnosis not present

## 2016-06-18 DIAGNOSIS — T380X5A Adverse effect of glucocorticoids and synthetic analogues, initial encounter: Secondary | ICD-10-CM

## 2016-06-18 DIAGNOSIS — M8580 Other specified disorders of bone density and structure, unspecified site: Secondary | ICD-10-CM

## 2016-06-18 NOTE — Progress Notes (Signed)
Miranda [name] Miranda [name] 12-Sep-1986 [id number]   History:    30 y.o.  for annual gyn exam with no complaints today patient is being followed by Dr. Ronnald Ramp her PCP. Patient with history of rheumatoid arthritis has steroid-induced osteopenia currently on Actonel had her last bone density study in December 2016 the right femoral neck had a Z score -2.3 left femoral neck -2.4 demonstrated decreased bone mineralization in 2013 patient had a laparoscopic bilateral ovarian cystectomy resectoscopic polypectomy on July 3 as a result of bilateral ovarian cyst, menorrhagia, anemia and intrauterine polyps. Her pathology report demonstrated the following:  Diagnosis  1. Ovary, cyst, right  - HEMORRHAGIC CORPUS LUTEAL CYST, NO ATYPIA OR MALIGNANCY.  2. Ovary, cyst, left  - HEMORRHAGIC CORPUS LUTEAL CYST. NO ATYPIA OR MALIGNANCY.  3. Endometrial polyp  - BENIGN ENDOMETRIAL POLYP AND ADJACENT BENIGN SECRETORY ENDOMETRIUM, NO  ATYPIA, HYPERPLASIA OR MALIGNANCY.  - UNDERLYING UNREMARKABLE MYOMETRIUM.  - SMALL POLYPOID FRAGMENT OF BENIGN ENDOCERVICAL MUCOSA, NO DYSPLASIA OR  MALIGNANCY.  Patient reports no past history of any abnormal Pap smears. Patient not sexually active reporting normal menstrual cycles  Past medical history,surgical history, family history and social history were all reviewed and documented in the EPIC chart.  Gynecologic History Patient's last menstrual period was 06/04/2016. Contraception: none Last Pap: 2016. Results were: normal Last mammogram: Not indicated. Results were: Not indicated  Obstetric History OB History  Gravida Para Term Preterm AB Living  0            SAB TAB Ectopic Multiple Live Births                    ROS: A ROS was performed and pertinent positives and negatives are included in the history.  GENERAL: No fevers or chills. HEENT: No change in vision, no earache, sore throat or sinus congestion. NECK: No pain or stiffness. CARDIOVASCULAR: No  chest pain or pressure. No palpitations. PULMONARY: No shortness of breath, cough or wheeze. GASTROINTESTINAL: No abdominal pain, nausea, vomiting or diarrhea, melena or bright red blood per rectum. GENITOURINARY: No urinary frequency, urgency, hesitancy or dysuria. MUSCULOSKELETAL: No joint or muscle pain, no back pain, no recent trauma. DERMATOLOGIC: No rash, no itching, no lesions. ENDOCRINE: No polyuria, polydipsia, no heat or cold intolerance. No recent change in weight. HEMATOLOGICAL: No anemia or easy bruising or bleeding. NEUROLOGIC: No headache, seizures, numbness, tingling or weakness. PSYCHIATRIC: No depression, no loss of interest in normal activity or change in sleep pattern.     Exam: chaperone present  BP 122/78   Ht 5\' 4"  (1.626 m)   Wt 135 lb (61.2 kg)   LMP 06/04/2016   BMI 23.17 kg/m   Body mass index is 23.17 kg/m.  General appearance : Well developed well nourished female. No acute distress HEENT: Eyes: no retinal hemorrhage or exudates,  Neck supple, trachea midline, no carotid bruits, no thyroidmegaly Lungs: Clear to auscultation, no rhonchi or wheezes, or rib retractions  Heart: Regular rate and rhythm, no murmurs or gallops Breast:Examined in sitting and supine position were symmetrical in appearance, no palpable masses or tenderness,  no skin retraction, no nipple inversion, no nipple discharge, no skin discoloration, no axillary or supraclavicular lymphadenopathy Abdomen: no palpable masses or tenderness, no rebound or guarding Extremities: no edema or skin discoloration or tenderness  Pelvic:  Bartholin, Urethra, Skene Glands: Within normal limits             Vagina: No gross lesions or discharge  Cervix: No gross lesions or discharge  Uterus  anteverted, normal size, shape and consistency, non-tender and mobile  Adnexa  Without masses or tenderness  Anus and perineum  normal   Rectovaginal  normal sphincter tone without palpated masses or tenderness              Hemoccult not indicated     Assessment/Plan:  30 y.o. female for annual exam being followed by her PCP for steroid-induced osteopenia currently on Actonel every week she is due for her next bone density study in 2018. Patient taking vitamin D 5000 units daily along with calcium supplementation. Pap smear not indicated this year. Her PCP will be given her the flu vaccine in the next few days when she goes for her annual exam and lab work.   Terrance Mass MD, 3:00 PM 06/18/2016

## 2016-06-18 NOTE — Patient Instructions (Signed)

## 2016-07-04 ENCOUNTER — Ambulatory Visit (INDEPENDENT_AMBULATORY_CARE_PROVIDER_SITE_OTHER): Payer: 59 | Admitting: Internal Medicine

## 2016-07-04 ENCOUNTER — Other Ambulatory Visit (INDEPENDENT_AMBULATORY_CARE_PROVIDER_SITE_OTHER): Payer: 59

## 2016-07-04 ENCOUNTER — Encounter: Payer: Self-pay | Admitting: Internal Medicine

## 2016-07-04 VITALS — BP 108/82 | HR 86 | Temp 99.0°F | Resp 16 | Wt 136.0 lb

## 2016-07-04 DIAGNOSIS — Z0000 Encounter for general adult medical examination without abnormal findings: Secondary | ICD-10-CM

## 2016-07-04 DIAGNOSIS — D508 Other iron deficiency anemias: Secondary | ICD-10-CM

## 2016-07-04 DIAGNOSIS — Z23 Encounter for immunization: Secondary | ICD-10-CM | POA: Diagnosis not present

## 2016-07-04 DIAGNOSIS — M839 Adult osteomalacia, unspecified: Secondary | ICD-10-CM | POA: Diagnosis not present

## 2016-07-04 LAB — COMPREHENSIVE METABOLIC PANEL
ALK PHOS: 87 U/L (ref 39–117)
ALT: 7 U/L (ref 0–35)
AST: 14 U/L (ref 0–37)
Albumin: 4 g/dL (ref 3.5–5.2)
BUN: 16 mg/dL (ref 6–23)
CO2: 25 meq/L (ref 19–32)
Calcium: 9.6 mg/dL (ref 8.4–10.5)
Chloride: 102 mEq/L (ref 96–112)
Creatinine, Ser: 0.5 mg/dL (ref 0.40–1.20)
GFR: 183 mL/min (ref >60.00)
GLUCOSE: 93 mg/dL (ref 70–99)
POTASSIUM: 3.5 meq/L (ref 3.5–5.1)
SODIUM: 136 meq/L (ref 135–145)
TOTAL PROTEIN: 8.9 g/dL — AB (ref 6.0–8.3)
Total Bilirubin: 0.2 mg/dL (ref 0.2–1.2)

## 2016-07-04 LAB — IBC PANEL
Iron: 18 ug/dL — ABNORMAL LOW (ref 42–145)
Saturation Ratios: 5.2 % — ABNORMAL LOW (ref 20.0–50.0)
Transferrin: 246 mg/dL (ref 212.0–360.0)

## 2016-07-04 LAB — CBC WITH DIFFERENTIAL/PLATELET
BASOS ABS: 0.1 10*3/uL (ref 0.0–0.1)
Basophils Relative: 1 % (ref 0.0–3.0)
Eosinophils Absolute: 0.1 10*3/uL (ref 0.0–0.7)
Eosinophils Relative: 1.7 % (ref 0.0–5.0)
HCT: 31.5 % — ABNORMAL LOW (ref 36.0–46.0)
Hemoglobin: 10.3 g/dL — ABNORMAL LOW (ref 12.0–15.0)
LYMPHS ABS: 3.2 10*3/uL (ref 0.7–4.0)
Lymphocytes Relative: 38.7 % (ref 12.0–46.0)
MCHC: 32.7 g/dL (ref 30.0–36.0)
MCV: 82.6 fl (ref 78.0–100.0)
MONO ABS: 0.4 10*3/uL (ref 0.1–1.0)
Monocytes Relative: 4.5 % (ref 3.0–12.0)
NEUTROS PCT: 54.1 % (ref 43.0–77.0)
Neutro Abs: 4.5 10*3/uL (ref 1.4–7.7)
Platelets: 368 10*3/uL (ref 150.0–400.0)
RBC: 3.82 Mil/uL — AB (ref 3.87–5.11)
RDW: 15.4 % (ref 11.5–15.5)
WBC: 8.4 10*3/uL (ref 4.0–10.5)

## 2016-07-04 LAB — FERRITIN: Ferritin: 326.6 ng/mL — ABNORMAL HIGH (ref 10.0–291.0)

## 2016-07-04 LAB — TSH: TSH: 0.7 u[IU]/mL (ref 0.35–4.50)

## 2016-07-04 LAB — VITAMIN D 25 HYDROXY (VIT D DEFICIENCY, FRACTURES): VITD: 44.06 ng/mL (ref 30.00–100.00)

## 2016-07-04 MED ORDER — FERROUS SULFATE 325 (65 FE) MG PO TABS: 325.0000 mg | ORAL_TABLET | Freq: Three times a day (TID) | ORAL | 11 refills | Status: DC

## 2016-07-04 NOTE — Progress Notes (Signed)
Subjective:  Patient ID: Miranda [name], female    DOB: 19-Mar-1986  Age: 30 y.o. MRN: [id number]  CC: Annual Exam (annual exam and flu shot) and Anemia   HPI Miranda [name] presents for a CPX.  She complains of symptoms consistent with iron deficiency anemia such as feeling cold and fatigue frequently. She saw her gynecologist about 2 weeks ago because she continues to have heavy menstrual cycles that last about 4 days with clotting. Her gynecologist did her Pap smear and told her she needed to be on an IUD to control the menstrual bleeding but she is not willing to pay for an IUD.  She also tells me that she recently saw her rheumatologist and was asked to start an additional therapy for rheumatoid arthritis but she is not willing to pay for that reason either. She has joint aches in her fingers and wrists but says there are no joints that are swollen.    Outpatient Medications Prior to Visit  Medication Sig Dispense Refill  . Cholecalciferol 2000 UNITS TABS Take 1 tablet (2,000 Units total) by mouth daily. 90 tablet 3  . ibuprofen (ADVIL,MOTRIN) 200 MG tablet Take 400-800 mg by mouth every 6 (six) hours as needed.    . Multiple Vitamins-Iron (MULTIVITAMINS WITH IRON) TABS Take 1 tablet by mouth daily.     . risedronate (ACTONEL) 35 MG tablet Take 1 tablet (35 mg total) by mouth every 7 (seven) days. with water on empty stomach, nothing by mouth or lie down for next 30 minutes. 4 tablet 11  . ferrous sulfate 325 (65 FE) MG tablet Take 1 tablet (325 mg total) by mouth 3 (three) times daily with meals. 90 tablet 11  . calcium-vitamin D (OSCAL WITH D) 250-125 MG-UNIT per tablet Take 1 tablet by mouth daily.      . fluconazole (DIFLUCAN) 150 MG tablet One PO q weekly for 6 months (Patient not taking: Reported on 06/18/2016) 4 tablet 5   No facility-administered medications prior to visit.     ROS Review of Systems  Constitutional: Positive for fatigue. Negative for activity change,  appetite change, chills, diaphoresis, fever and unexpected weight change.  HENT: Negative.  Negative for trouble swallowing.   Respiratory: Negative.  Negative for cough, choking, chest tightness, shortness of breath and stridor.   Cardiovascular: Negative.  Negative for chest pain, palpitations and leg swelling.  Gastrointestinal: Negative for abdominal pain, anal bleeding, blood in stool, constipation, diarrhea, nausea and vomiting.  Endocrine: Positive for cold intolerance.  Genitourinary: Negative.   Musculoskeletal: Positive for arthralgias. Negative for back pain, joint swelling, myalgias and neck pain.  Skin: Negative.  Negative for color change, pallor and rash.  Allergic/Immunologic: Negative.   Neurological: Negative.  Negative for dizziness, weakness, numbness and headaches.  Hematological: Negative.  Negative for adenopathy. Does not bruise/bleed easily.  Psychiatric/Behavioral: Negative.     Objective:  BP 108/82 (BP Location: Left Arm, Patient Position: Sitting, Cuff Size: Normal)   Pulse 86   Temp 99 F (37.2 C) (Oral)   Resp 16   Wt 136 lb (61.7 kg)   LMP 06/04/2016   SpO2 99%   BMI 23.34 kg/m   BP Readings from Last 3 Encounters:  07/04/16 108/82  06/18/16 122/78  11/07/15 118/78    Wt Readings from Last 3 Encounters:  07/04/16 136 lb (61.7 kg)  06/18/16 135 lb (61.2 kg)  11/07/15 139 lb (63 kg)    Physical Exam  Constitutional: She is oriented to  person, place, and time. No distress.  HENT:  Head: Normocephalic and atraumatic.  Mouth/Throat: Oropharynx is clear and moist. No oropharyngeal exudate.  Eyes: Conjunctivae are normal. Right eye exhibits no discharge. Left eye exhibits no discharge. No scleral icterus.  Neck: Normal range of motion. Neck supple. No JVD present. No tracheal deviation present. No thyromegaly present.  Cardiovascular: Normal rate, regular rhythm, normal heart sounds and intact distal pulses.  Exam reveals no gallop and no  friction rub.   No murmur heard. Pulmonary/Chest: Effort normal and breath sounds normal. No stridor. No respiratory distress. She has no wheezes. She has no rales. She exhibits no tenderness.  Abdominal: Soft. Bowel sounds are normal. She exhibits no distension and no mass. There is no tenderness. There is no rebound and no guarding.  Musculoskeletal: Normal range of motion. She exhibits no edema, tenderness or deformity.  Lymphadenopathy:    She has no cervical adenopathy.  Neurological: She is oriented to person, place, and time.  Skin: Skin is warm and dry. No rash noted. She is not diaphoretic. No erythema. No pallor.  Vitals reviewed.   Lab Results  Component Value Date   WBC 8.4 07/04/2016   HGB 10.3 (L) 07/04/2016   HCT 31.5 (L) 07/04/2016   PLT 368.0 07/04/2016   GLUCOSE 93 07/04/2016   CHOL 169 05/16/2015   TRIG 66.0 05/16/2015   HDL 41.80 05/16/2015   LDLCALC 114 (H) 05/16/2015   ALT 7 07/04/2016   AST 14 07/04/2016   NA 136 07/04/2016   K 3.5 07/04/2016   CL 102 07/04/2016   CREATININE 0.50 07/04/2016   BUN 16 07/04/2016   CO2 25 07/04/2016   TSH 0.70 07/04/2016   HGBA1C 5.8 05/16/2015    Dg Bone Density  Result Date: 08/28/2015 Date of study: 08/22/15 Exam: DUAL X-RAY ABSORPTIOMETRY (DXA) FOR BONE MINERAL DENSITY (BMD) Instrument: [employer] Provider: PCP Indication: follow up for low BMD Comparison: none (please note that it is not possible to compare data from different instruments) Clinical data: Pt is a premenopausal 30 y.o. female with no previous h/o fracture. On calcium and vitamin D. Results:  Lumbar spine (L1-L4) Femoral neck (FN) 33% distal radius Z-score -0.8 RFN:-2.3 LFN:-2.4 n/a Change in BMD from previous DXA test (%) n/a n/a n/a (*) statistically significant Assessment: the BMD is low according to the [facility] classification for osteoporosis (see below). Fracture risk: moderate FRAX score: not calculated due to premenopausal status .  Comments: the technical quality of the study is good. Evaluation for secondary causes should be considered if clinically indicated. Recommend optimizing calcium (1200 mg/day) and vitamin D (800 IU/day) intake. Followup: Repeat BMD is appropriate after 2 years or after 1-2 years if starting treatment. WHO criteria for diagnosis of osteoporosis in postmenopausal women and in men 54 y/o or older: - normal: T-score -1.0 to + 1.0 - osteopenia/low bone density: T-score between -2.5 and -1.0 - osteoporosis: T-score below -2.5 - severe osteoporosis: T-score below -2.5 with history of fragility fracture Note: although not part of the WHO classification, the presence of a fragility fracture, regardless of the T-score, should be considered diagnostic of osteoporosis, provided other causes for the fracture have been excluded. Treatment: The National Osteoporosis Foundation recommends that treatment be considered in postmenopausal women and men age 75 or older with: 1. Hip or vertebral (clinical or morphometric) fracture 2. T-score of - 2.5 or lower at the spine or hip 3. 10-year fracture probability by FRAX of at least 20%  for a major osteoporotic fracture and 3% for a hip fracture Loura Pardon MD    Assessment & Plan:   Dannetta was seen today for annual exam and anemia.  Diagnoses and all orders for this visit:  Need for prophylactic vaccination and inoculation against influenza -     Flu Vaccine QUAD 36+ mos IM  Vitamin D deficient osteomalacia- this has been adequately repleted with an oral supplement -     VITAMIN D 25 Hydroxy (Vit-D Deficiency, Fractures); Future  Other iron deficiency anemia- she is still anemic and her iron level is the lowest it has been in 4 years, I've asked her to be more compliant with iron replacement therapy and to reconsider whether or not she will use an IUD to control her heavy menstrual cycles. -     IBC panel; Future -     Ferritin; Future -     ferrous sulfate 325 (65 FE)  MG tablet; Take 1 tablet (325 mg total) by mouth 3 (three) times daily with meals.  Routine general medical examination at a health care facility- exam completed, labs ordered and reviewed, vaccines reviewed and updated, patient education material was given. -     Lipid panel; Future -     CBC with Differential/Platelet; Future -     Comprehensive metabolic panel; Future -     TSH; Future   I have discontinued Ms. Zirkelbach's calcium-vitamin D and fluconazole. I am also having her maintain her multivitamins with iron, ibuprofen, Cholecalciferol, risedronate, Calcium Carbonate (CALCIUM 600 PO), and ferrous sulfate.  Meds ordered this encounter  Medications  . Calcium Carbonate (CALCIUM 600 PO)    Sig: Take by mouth.  . ferrous sulfate 325 (65 FE) MG tablet    Sig: Take 1 tablet (325 mg total) by mouth 3 (three) times daily with meals.    Dispense:  90 tablet    Refill:  11     Follow-up: Return in about 4 months (around 11/04/2016).  Scarlette Calico, MD

## 2016-07-04 NOTE — Patient Instructions (Signed)
Preventive Care for Adults, Female A healthy lifestyle and preventive care can promote health and wellness. Preventive health guidelines for women include the following key practices.  A routine yearly physical is a good way to check with your health care provider about your health and preventive screening. It is a chance to share any concerns and updates on your health and to receive a thorough exam.  Visit your dentist for a routine exam and preventive care every 6 months. Brush your teeth twice a day and floss once a day. Good oral hygiene prevents tooth decay and gum disease.  The frequency of eye exams is based on your age, health, family medical history, use of contact lenses, and other factors. Follow your health care provider's recommendations for frequency of eye exams.  Eat a healthy diet. Foods like vegetables, fruits, whole grains, low-fat dairy products, and lean protein foods contain the nutrients you need without too many calories. Decrease your intake of foods high in solid fats, added sugars, and salt. Eat the right amount of calories for you.Get information about a proper diet from your health care provider, if necessary.  Regular physical exercise is one of the most important things you can do for your health. Most adults should get at least 150 minutes of moderate-intensity exercise (any activity that increases your heart rate and causes you to sweat) each week. In addition, most adults need muscle-strengthening exercises on 2 or more days a week.  Maintain a healthy weight. The body mass index (BMI) is a screening tool to identify possible weight problems. It provides an estimate of body fat based on height and weight. Your health care provider can find your BMI and can help you achieve or maintain a healthy weight.For adults 20 years and older:  A BMI below 18.5 is considered underweight.  A BMI of 18.5 to 24.9 is normal.  A BMI of 25 to 29.9 is considered overweight.  A  BMI of 30 and above is considered obese.  Maintain normal blood lipids and cholesterol levels by exercising and minimizing your intake of saturated fat. Eat a balanced diet with plenty of fruit and vegetables. Blood tests for lipids and cholesterol should begin at age 45 and be repeated every 5 years. If your lipid or cholesterol levels are high, you are over 50, or you are at high risk for heart disease, you may need your cholesterol levels checked more frequently.Ongoing high lipid and cholesterol levels should be treated with medicines if diet and exercise are not working.  If you smoke, find out from your health care provider how to quit. If you do not use tobacco, do not start.  Lung cancer screening is recommended for adults aged 45-80 years who are at high risk for developing lung cancer because of a history of smoking. A yearly low-dose CT scan of the lungs is recommended for people who have at least a 30-pack-year history of smoking and are a current smoker or have quit within the past 15 years. A pack year of smoking is smoking an average of 1 pack of cigarettes a day for 1 year (for example: 1 pack a day for 30 years or 2 packs a day for 15 years). Yearly screening should continue until the smoker has stopped smoking for at least 15 years. Yearly screening should be stopped for people who develop a health problem that would prevent them from having lung cancer treatment.  If you are pregnant, do not drink alcohol. If you are  breastfeeding, be very cautious about drinking alcohol. If you are not pregnant and choose to drink alcohol, do not have more than 1 drink per day. One drink is considered to be 12 ounces (355 mL) of beer, 5 ounces (148 mL) of wine, or 1.5 ounces (44 mL) of liquor.  Avoid use of street drugs. Do not share needles with anyone. Ask for help if you need support or instructions about stopping the use of drugs.  High blood pressure causes heart disease and increases the risk  of stroke. Your blood pressure should be checked at least every 1 to 2 years. Ongoing high blood pressure should be treated with medicines if weight loss and exercise do not work.  If you are 55-79 years old, ask your health care provider if you should take aspirin to prevent strokes.  Diabetes screening is done by taking a blood sample to check your blood glucose level after you have not eaten for a certain period of time (fasting). If you are not overweight and you do not have risk factors for diabetes, you should be screened once every 3 years starting at age 45. If you are overweight or obese and you are 40-70 years of age, you should be screened for diabetes every year as part of your cardiovascular risk assessment.  Breast cancer screening is essential preventive care for women. You should practice "breast self-awareness." This means understanding the normal appearance and feel of your breasts and may include breast self-examination. Any changes detected, no matter how small, should be reported to a health care provider. Women in their 20s and 30s should have a clinical breast exam (CBE) by a health care provider as part of a regular health exam every 1 to 3 years. After age 40, women should have a CBE every year. Starting at age 40, women should consider having a mammogram (breast X-ray test) every year. Women who have a family history of breast cancer should talk to their health care provider about genetic screening. Women at a high risk of breast cancer should talk to their health care providers about having an MRI and a mammogram every year.  Breast cancer gene (BRCA)-related cancer risk assessment is recommended for women who have family members with BRCA-related cancers. BRCA-related cancers include breast, ovarian, tubal, and peritoneal cancers. Having family members with these cancers may be associated with an increased risk for harmful changes (mutations) in the breast cancer genes BRCA1 and  BRCA2. Results of the assessment will determine the need for genetic counseling and BRCA1 and BRCA2 testing.  Your health care provider may recommend that you be screened regularly for cancer of the pelvic organs (ovaries, uterus, and vagina). This screening involves a pelvic examination, including checking for microscopic changes to the surface of your cervix (Pap test). You may be encouraged to have this screening done every 3 years, beginning at age 21.  For women ages 30-65, health care providers may recommend pelvic exams and Pap testing every 3 years, or they may recommend the Pap and pelvic exam, combined with testing for human papilloma virus (HPV), every 5 years. Some types of HPV increase your risk of cervical cancer. Testing for HPV may also be done on women of any age with unclear Pap test results.  Other health care providers may not recommend any screening for nonpregnant women who are considered low risk for pelvic cancer and who do not have symptoms. Ask your health care provider if a screening pelvic exam is right for   you.  If you have had past treatment for cervical cancer or a condition that could lead to cancer, you need Pap tests and screening for cancer for at least 20 years after your treatment. If Pap tests have been discontinued, your risk factors (such as having a new sexual partner) need to be reassessed to determine if screening should resume. Some women have medical problems that increase the chance of getting cervical cancer. In these cases, your health care provider may recommend more frequent screening and Pap tests.  Colorectal cancer can be detected and often prevented. Most routine colorectal cancer screening begins at the age of 50 years and continues through age 75 years. However, your health care provider may recommend screening at an earlier age if you have risk factors for colon cancer. On a yearly basis, your health care provider may provide home test kits to check  for hidden blood in the stool. Use of a small camera at the end of a tube, to directly examine the colon (sigmoidoscopy or colonoscopy), can detect the earliest forms of colorectal cancer. Talk to your health care provider about this at age 50, when routine screening begins. Direct exam of the colon should be repeated every 5-10 years through age 75 years, unless early forms of precancerous polyps or small growths are found.  People who are at an increased risk for hepatitis B should be screened for this virus. You are considered at high risk for hepatitis B if:  You were born in a country where hepatitis B occurs often. Talk with your health care provider about which countries are considered high risk.  Your parents were born in a high-risk country and you have not received a shot to protect against hepatitis B (hepatitis B vaccine).  You have HIV or AIDS.  You use needles to inject street drugs.  You live with, or have sex with, someone who has hepatitis B.  You get hemodialysis treatment.  You take certain medicines for conditions like cancer, organ transplantation, and autoimmune conditions.  Hepatitis C blood testing is recommended for all people born from 1945 through 1965 and any individual with known risks for hepatitis C.  Practice safe sex. Use condoms and avoid high-risk sexual practices to reduce the spread of sexually transmitted infections (STIs). STIs include gonorrhea, chlamydia, syphilis, trichomonas, herpes, HPV, and human immunodeficiency virus (HIV). Herpes, HIV, and HPV are viral illnesses that have no cure. They can result in disability, cancer, and death.  You should be screened for sexually transmitted illnesses (STIs) including gonorrhea and chlamydia if:  You are sexually active and are younger than 24 years.  You are older than 24 years and your health care provider tells you that you are at risk for this type of infection.  Your sexual activity has changed  since you were last screened and you are at an increased risk for chlamydia or gonorrhea. Ask your health care provider if you are at risk.  If you are at risk of being infected with HIV, it is recommended that you take a prescription medicine daily to prevent HIV infection. This is called preexposure prophylaxis (PrEP). You are considered at risk if:  You are sexually active and do not regularly use condoms or know the HIV status of your partner(s).  You take drugs by injection.  You are sexually active with a partner who has HIV.  Talk with your health care provider about whether you are at high risk of being infected with HIV. If   you choose to begin PrEP, you should first be tested for HIV. You should then be tested every 3 months for as long as you are taking PrEP.  Osteoporosis is a disease in which the bones lose minerals and strength with aging. This can result in serious bone fractures or breaks. The risk of osteoporosis can be identified using a bone density scan. Women ages 67 years and over and women at risk for fractures or osteoporosis should discuss screening with their health care providers. Ask your health care provider whether you should take a calcium supplement or vitamin D to reduce the rate of osteoporosis.  Menopause can be associated with physical symptoms and risks. Hormone replacement therapy is available to decrease symptoms and risks. You should talk to your health care provider about whether hormone replacement therapy is right for you.  Use sunscreen. Apply sunscreen liberally and repeatedly throughout the day. You should seek shade when your shadow is shorter than you. Protect yourself by wearing long sleeves, pants, a wide-brimmed hat, and sunglasses year round, whenever you are outdoors.  Once a month, do a whole body skin exam, using a mirror to look at the skin on your back. Tell your health care provider of new moles, moles that have irregular borders, moles that  are larger than a pencil eraser, or moles that have changed in shape or color.  Stay current with required vaccines (immunizations).  Influenza vaccine. All adults should be immunized every year.  Tetanus, diphtheria, and acellular pertussis (Td, Tdap) vaccine. Pregnant women should receive 1 dose of Tdap vaccine during each pregnancy. The dose should be obtained regardless of the length of time since the last dose. Immunization is preferred during the 27th-36th week of gestation. An adult who has not previously received Tdap or who does not know her vaccine status should receive 1 dose of Tdap. This initial dose should be followed by tetanus and diphtheria toxoids (Td) booster doses every 10 years. Adults with an unknown or incomplete history of completing a 3-dose immunization series with Td-containing vaccines should begin or complete a primary immunization series including a Tdap dose. Adults should receive a Td booster every 10 years.  Varicella vaccine. An adult without evidence of immunity to varicella should receive 2 doses or a second dose if she has previously received 1 dose. Pregnant females who do not have evidence of immunity should receive the first dose after pregnancy. This first dose should be obtained before leaving the health care facility. The second dose should be obtained 4-8 weeks after the first dose.  Human papillomavirus (HPV) vaccine. Females aged 13-26 years who have not received the vaccine previously should obtain the 3-dose series. The vaccine is not recommended for use in pregnant females. However, pregnancy testing is not needed before receiving a dose. If a female is found to be pregnant after receiving a dose, no treatment is needed. In that case, the remaining doses should be delayed until after the pregnancy. Immunization is recommended for any person with an immunocompromised condition through the age of 61 years if she did not get any or all doses earlier. During the  3-dose series, the second dose should be obtained 4-8 weeks after the first dose. The third dose should be obtained 24 weeks after the first dose and 16 weeks after the second dose.  Zoster vaccine. One dose is recommended for adults aged 30 years or older unless certain conditions are present.  Measles, mumps, and rubella (MMR) vaccine. Adults born  before 1957 generally are considered immune to measles and mumps. Adults born in 1957 or later should have 1 or more doses of MMR vaccine unless there is a contraindication to the vaccine or there is laboratory evidence of immunity to each of the three diseases. A routine second dose of MMR vaccine should be obtained at least 28 days after the first dose for students attending postsecondary schools, health care workers, or international travelers. People who received inactivated measles vaccine or an unknown type of measles vaccine during 1963-1967 should receive 2 doses of MMR vaccine. People who received inactivated mumps vaccine or an unknown type of mumps vaccine before 1979 and are at high risk for mumps infection should consider immunization with 2 doses of MMR vaccine. For females of childbearing age, rubella immunity should be determined. If there is no evidence of immunity, females who are not pregnant should be vaccinated. If there is no evidence of immunity, females who are pregnant should delay immunization until after pregnancy. Unvaccinated health care workers born before 1957 who lack laboratory evidence of measles, mumps, or rubella immunity or laboratory confirmation of disease should consider measles and mumps immunization with 2 doses of MMR vaccine or rubella immunization with 1 dose of MMR vaccine.  Pneumococcal 13-valent conjugate (PCV13) vaccine. When indicated, a person who is uncertain of his immunization history and has no record of immunization should receive the PCV13 vaccine. All adults 65 years of age and older should receive this  vaccine. An adult aged 19 years or older who has certain medical conditions and has not been previously immunized should receive 1 dose of PCV13 vaccine. This PCV13 should be followed with a dose of pneumococcal polysaccharide (PPSV23) vaccine. Adults who are at high risk for pneumococcal disease should obtain the PPSV23 vaccine at least 8 weeks after the dose of PCV13 vaccine. Adults older than 32 years of age who have normal immune system function should obtain the PPSV23 vaccine dose at least 1 year after the dose of PCV13 vaccine.  Pneumococcal polysaccharide (PPSV23) vaccine. When PCV13 is also indicated, PCV13 should be obtained first. All adults aged 65 years and older should be immunized. An adult younger than age 65 years who has certain medical conditions should be immunized. Any person who resides in a nursing home or long-term care facility should be immunized. An adult smoker should be immunized. People with an immunocompromised condition and certain other conditions should receive both PCV13 and PPSV23 vaccines. People with human immunodeficiency virus (HIV) infection should be immunized as soon as possible after diagnosis. Immunization during chemotherapy or radiation therapy should be avoided. Routine use of PPSV23 vaccine is not recommended for American Indians, [place] Natives, or people younger than 65 years unless there are medical conditions that require PPSV23 vaccine. When indicated, people who have unknown immunization and have no record of immunization should receive PPSV23 vaccine. One-time revaccination 5 years after the first dose of PPSV23 is recommended for people aged 19-64 years who have chronic kidney failure, nephrotic syndrome, asplenia, or immunocompromised conditions. People who received 1-2 doses of PPSV23 before age 65 years should receive another dose of PPSV23 vaccine at age 65 years or later if at least 5 years have passed since the previous dose. Doses of PPSV23 are not  needed for people immunized with PPSV23 at or after age 65 years.  Meningococcal vaccine. Adults with asplenia or persistent complement component deficiencies should receive 2 doses of quadrivalent meningococcal conjugate (MenACWY-D) vaccine. The doses should be obtained   at least 2 months apart. Microbiologists working with certain meningococcal bacteria, [place] recruits, people at risk during an outbreak, and people who travel to or live in countries with a high rate of meningitis should be immunized. A first-year college student up through age 34 years who is living in a residence hall should receive a dose if she did not receive a dose on or after her 16th birthday. Adults who have certain high-risk conditions should receive one or more doses of vaccine.  Hepatitis A vaccine. Adults who wish to be protected from this disease, have certain high-risk conditions, work with hepatitis A-infected animals, work in hepatitis A research labs, or travel to or work in countries with a high rate of hepatitis A should be immunized. Adults who were previously unvaccinated and who anticipate close contact with an international adoptee during the first 60 days after arrival in the [place] from a country with a high rate of hepatitis A should be immunized.  Hepatitis B vaccine. Adults who wish to be protected from this disease, have certain high-risk conditions, may be exposed to blood or other infectious body fluids, are household contacts or sex partners of hepatitis B positive people, are clients or workers in certain care facilities, or travel to or work in countries with a high rate of hepatitis B should be immunized.  Haemophilus influenzae type b (Hib) vaccine. A previously unvaccinated person with asplenia or sickle cell disease or having a scheduled splenectomy should receive 1 dose of Hib vaccine. Regardless of previous immunization, a recipient of a hematopoietic stem cell transplant should receive a  3-dose series 6-12 months after her successful transplant. Hib vaccine is not recommended for adults with HIV infection. Preventive Services / Frequency Ages 35 to 4 years  Blood pressure check.** / Every 3-5 years.  Lipid and cholesterol check.** / Every 5 years beginning at age 60.  Clinical breast exam.** / Every 3 years for women in their 71s and 10s.  BRCA-related cancer risk assessment.** / For women who have family members with a BRCA-related cancer (breast, ovarian, tubal, or peritoneal cancers).  Pap test.** / Every 2 years from ages 76 through 26. Every 3 years starting at age 61 through age 76 or 93 with a history of 3 consecutive normal Pap tests.  HPV screening.** / Every 3 years from ages 37 through ages 60 to 51 with a history of 3 consecutive normal Pap tests.  Hepatitis C blood test.** / For any individual with known risks for hepatitis C.  Skin self-exam. / Monthly.  Influenza vaccine. / Every year.  Tetanus, diphtheria, and acellular pertussis (Tdap, Td) vaccine.** / Consult your health care provider. Pregnant women should receive 1 dose of Tdap vaccine during each pregnancy. 1 dose of Td every 10 years.  Varicella vaccine.** / Consult your health care provider. Pregnant females who do not have evidence of immunity should receive the first dose after pregnancy.  HPV vaccine. / 3 doses over 6 months, if 93 and younger. The vaccine is not recommended for use in pregnant females. However, pregnancy testing is not needed before receiving a dose.  Measles, mumps, rubella (MMR) vaccine.** / You need at least 1 dose of MMR if you were born in 1957 or later. You may also need a 2nd dose. For females of childbearing age, rubella immunity should be determined. If there is no evidence of immunity, females who are not pregnant should be vaccinated. If there is no evidence of immunity, females who are  pregnant should delay immunization until after pregnancy.  Pneumococcal  13-valent conjugate (PCV13) vaccine.** / Consult your health care provider.  Pneumococcal polysaccharide (PPSV23) vaccine.** / 1 to 2 doses if you smoke cigarettes or if you have certain conditions.  Meningococcal vaccine.** / 1 dose if you are age 68 to 8 years and a Market researcher living in a residence hall, or have one of several medical conditions, you need to get vaccinated against meningococcal disease. You may also need additional booster doses.  Hepatitis A vaccine.** / Consult your health care provider.  Hepatitis B vaccine.** / Consult your health care provider.  Haemophilus influenzae type b (Hib) vaccine.** / Consult your health care provider. Ages 7 to 53 years  Blood pressure check.** / Every year.  Lipid and cholesterol check.** / Every 5 years beginning at age 25 years.  Lung cancer screening. / Every year if you are aged 11-80 years and have a 30-pack-year history of smoking and currently smoke or have quit within the past 15 years. Yearly screening is stopped once you have quit smoking for at least 15 years or develop a health problem that would prevent you from having lung cancer treatment.  Clinical breast exam.** / Every year after age 48 years.  BRCA-related cancer risk assessment.** / For women who have family members with a BRCA-related cancer (breast, ovarian, tubal, or peritoneal cancers).  Mammogram.** / Every year beginning at age 41 years and continuing for as long as you are in good health. Consult with your health care provider.  Pap test.** / Every 3 years starting at age 65 years through age 37 or 70 years with a history of 3 consecutive normal Pap tests.  HPV screening.** / Every 3 years from ages 72 years through ages 60 to 40 years with a history of 3 consecutive normal Pap tests.  Fecal occult blood test (FOBT) of stool. / Every year beginning at age 21 years and continuing until age 5 years. You may not need to do this test if you get  a colonoscopy every 10 years.  Flexible sigmoidoscopy or colonoscopy.** / Every 5 years for a flexible sigmoidoscopy or every 10 years for a colonoscopy beginning at age 35 years and continuing until age 48 years.  Hepatitis C blood test.** / For all people born from [birth year] through 1965 and any individual with known risks for hepatitis C.  Skin self-exam. / Monthly.  Influenza vaccine. / Every year.  Tetanus, diphtheria, and acellular pertussis (Tdap/Td) vaccine.** / Consult your health care provider. Pregnant women should receive 1 dose of Tdap vaccine during each pregnancy. 1 dose of Td every 10 years.  Varicella vaccine.** / Consult your health care provider. Pregnant females who do not have evidence of immunity should receive the first dose after pregnancy.  Zoster vaccine.** / 1 dose for adults aged 30 years or older.  Measles, mumps, rubella (MMR) vaccine.** / You need at least 1 dose of MMR if you were born in 1957 or later. You may also need a second dose. For females of childbearing age, rubella immunity should be determined. If there is no evidence of immunity, females who are not pregnant should be vaccinated. If there is no evidence of immunity, females who are pregnant should delay immunization until after pregnancy.  Pneumococcal 13-valent conjugate (PCV13) vaccine.** / Consult your health care provider.  Pneumococcal polysaccharide (PPSV23) vaccine.** / 1 to 2 doses if you smoke cigarettes or if you have certain conditions.  Meningococcal vaccine.** /  Consult your health care provider.  Hepatitis A vaccine.** / Consult your health care provider.  Hepatitis B vaccine.** / Consult your health care provider.  Haemophilus influenzae type b (Hib) vaccine.** / Consult your health care provider. Ages 64 years and over  Blood pressure check.** / Every year.  Lipid and cholesterol check.** / Every 5 years beginning at age 23 years.  Lung cancer screening. / Every year if you  are aged 16-80 years and have a 30-pack-year history of smoking and currently smoke or have quit within the past 15 years. Yearly screening is stopped once you have quit smoking for at least 15 years or develop a health problem that would prevent you from having lung cancer treatment.  Clinical breast exam.** / Every year after age 74 years.  BRCA-related cancer risk assessment.** / For women who have family members with a BRCA-related cancer (breast, ovarian, tubal, or peritoneal cancers).  Mammogram.** / Every year beginning at age 44 years and continuing for as long as you are in good health. Consult with your health care provider.  Pap test.** / Every 3 years starting at age 58 years through age 22 or 39 years with 3 consecutive normal Pap tests. Testing can be stopped between 65 and 70 years with 3 consecutive normal Pap tests and no abnormal Pap or HPV tests in the past 10 years.  HPV screening.** / Every 3 years from ages 64 years through ages 70 or 61 years with a history of 3 consecutive normal Pap tests. Testing can be stopped between 65 and 70 years with 3 consecutive normal Pap tests and no abnormal Pap or HPV tests in the past 10 years.  Fecal occult blood test (FOBT) of stool. / Every year beginning at age 40 years and continuing until age 27 years. You may not need to do this test if you get a colonoscopy every 10 years.  Flexible sigmoidoscopy or colonoscopy.** / Every 5 years for a flexible sigmoidoscopy or every 10 years for a colonoscopy beginning at age 7 years and continuing until age 32 years.  Hepatitis C blood test.** / For all people born from [birth year] through 1967 and any individual with known risks for hepatitis C.  Osteoporosis screening.** / A one-time screening for women ages 30 years and over and women at risk for fractures or osteoporosis.  Skin self-exam. / Monthly.  Influenza vaccine. / Every year.  Tetanus, diphtheria, and acellular pertussis (Tdap/Td)  vaccine.** / 1 dose of Td every 10 years.  Varicella vaccine.** / Consult your health care provider.  Zoster vaccine.** / 1 dose for adults aged 35 years or older.  Pneumococcal 13-valent conjugate (PCV13) vaccine.** / Consult your health care provider.  Pneumococcal polysaccharide (PPSV23) vaccine.** / 1 dose for all adults aged 46 years and older.  Meningococcal vaccine.** / Consult your health care provider.  Hepatitis A vaccine.** / Consult your health care provider.  Hepatitis B vaccine.** / Consult your health care provider.  Haemophilus influenzae type b (Hib) vaccine.** / Consult your health care provider. ** Family history and personal history of risk and conditions may change your health care provider's recommendations.   This information is not intended to replace advice given to you by your health care provider. Make sure you discuss any questions you have with your health care provider.   Document Released: 11/06/2001 Document Revised: 10/01/2014 Document Reviewed: 02/05/2011 Elsevier Interactive Patient Education [employer].

## 2016-07-04 NOTE — Progress Notes (Signed)
Pre visit review using our clinic review tool, if applicable. No additional management support is needed unless otherwise documented below in the visit note. 

## 2016-11-21 ENCOUNTER — Other Ambulatory Visit: Payer: Self-pay

## 2016-11-21 NOTE — Patient Outreach (Signed)
Screening: Placed call to patient after new referral for self reported poor on health screening tool with [employer].  Placed call to patient. No answer. Left a [employer] message and requested a call back.  PLAN: will attempt to reach patient at later date.  Tomasa Rand, RN, BSN, CEN [facility] [employer] [phone]

## 2016-11-22 NOTE — Patient Outreach (Signed)
New referral for self reported poor on risk assessment:  Placed call to patient on 11/21/2016 with no answer. Left a message requesting a call back. Placed 2nd attempt on 11/22/2016  With no answer. Left a message requesting a call back.  Tomasa Rand, RN, BSN, CEN [facility] [employer] [phone]

## 2016-11-27 ENCOUNTER — Other Ambulatory Visit: Payer: Self-pay

## 2016-11-27 NOTE — Patient Outreach (Signed)
Screening: 3rd unsuccessful attempt to reach patient for screening call.  PLAN: will send unable to reach letter. If no response will close in 10 days.  Tomasa Rand, RN, BSN, CEN [facility] [employer] [phone]

## 2016-12-11 ENCOUNTER — Other Ambulatory Visit: Payer: Self-pay

## 2016-12-11 NOTE — Patient Outreach (Signed)
Case closure: No response to phone calls x 3 and no response to outreach letter.  PLAN: will close case and notify [facility] with [facility].  Tomasa Rand, RN, BSN, CEN [facility] [employer] [phone]

## 2017-02-06 ENCOUNTER — Encounter: Payer: Self-pay | Admitting: Gynecology

## 2017-07-05 ENCOUNTER — Ambulatory Visit: Payer: 59

## 2017-07-05 ENCOUNTER — Telehealth (INDEPENDENT_AMBULATORY_CARE_PROVIDER_SITE_OTHER): Payer: 59

## 2017-07-05 DIAGNOSIS — Z23 Encounter for immunization: Secondary | ICD-10-CM

## 2017-07-08 NOTE — Telephone Encounter (Signed)
error 

## 2018-03-04 ENCOUNTER — Encounter: Payer: Self-pay | Admitting: Internal Medicine

## 2018-03-04 NOTE — Telephone Encounter (Signed)
Dr Ronnald Ramp, would you be willing to work this patient in to be seen before July 1st for her physical?

## 2018-03-04 NOTE — Telephone Encounter (Signed)
TJ is okay with adding on this pt for CPE - double book or whatever you can for the 21st or 25th. Read pt my chart message for specifics please.

## 2018-03-18 ENCOUNTER — Ambulatory Visit (INDEPENDENT_AMBULATORY_CARE_PROVIDER_SITE_OTHER): Payer: 59 | Admitting: Internal Medicine

## 2018-03-18 ENCOUNTER — Other Ambulatory Visit (INDEPENDENT_AMBULATORY_CARE_PROVIDER_SITE_OTHER): Payer: 59

## 2018-03-18 ENCOUNTER — Encounter: Payer: Self-pay | Admitting: Internal Medicine

## 2018-03-18 VITALS — BP 120/82 | HR 79 | Temp 98.1°F | Resp 16 | Ht 64.0 in | Wt 125.0 lb

## 2018-03-18 DIAGNOSIS — Z0000 Encounter for general adult medical examination without abnormal findings: Secondary | ICD-10-CM

## 2018-03-18 DIAGNOSIS — N920 Excessive and frequent menstruation with regular cycle: Secondary | ICD-10-CM | POA: Diagnosis not present

## 2018-03-18 DIAGNOSIS — M839 Adult osteomalacia, unspecified: Secondary | ICD-10-CM

## 2018-03-18 DIAGNOSIS — D500 Iron deficiency anemia secondary to blood loss (chronic): Secondary | ICD-10-CM

## 2018-03-18 LAB — CBC WITH DIFFERENTIAL/PLATELET
Basophils Absolute: 0.1 10*3/uL (ref 0.0–0.1)
Basophils Relative: 0.9 % (ref 0.0–3.0)
EOS ABS: 0.1 10*3/uL (ref 0.0–0.7)
Eosinophils Relative: 1.2 % (ref 0.0–5.0)
HEMATOCRIT: 29.7 % — AB (ref 36.0–46.0)
Hemoglobin: 9.7 g/dL — ABNORMAL LOW (ref 12.0–15.0)
LYMPHS ABS: 2.6 10*3/uL (ref 0.7–4.0)
LYMPHS PCT: 37.8 % (ref 12.0–46.0)
MCHC: 32.9 g/dL (ref 30.0–36.0)
MCV: 81.8 fl (ref 78.0–100.0)
MONO ABS: 0.4 10*3/uL (ref 0.1–1.0)
Monocytes Relative: 5.3 % (ref 3.0–12.0)
NEUTROS ABS: 3.8 10*3/uL (ref 1.4–7.7)
NEUTROS PCT: 54.8 % (ref 43.0–77.0)
PLATELETS: 397 10*3/uL (ref 150.0–400.0)
RBC: 3.63 Mil/uL — ABNORMAL LOW (ref 3.87–5.11)
RDW: 14.8 % (ref 11.5–15.5)
WBC: 6.9 10*3/uL (ref 4.0–10.5)

## 2018-03-18 LAB — LIPID PANEL
CHOLESTEROL: 161 mg/dL (ref 0–200)
HDL: 41.6 mg/dL (ref >39.00)
LDL CALC: 106 mg/dL — AB (ref 0–99)
NONHDL: 119.48
Total CHOL/HDL Ratio: 4
Triglycerides: 65 mg/dL (ref 0.0–149.0)
VLDL: 13 mg/dL (ref 0.0–40.0)

## 2018-03-18 LAB — FERRITIN: Ferritin: 198.8 ng/mL (ref 10.0–291.0)

## 2018-03-18 LAB — IBC PANEL
IRON: 32 ug/dL — AB (ref 42–145)
Saturation Ratios: 7.9 % — ABNORMAL LOW (ref 20.0–50.0)
TRANSFERRIN: 289 mg/dL (ref 212.0–360.0)

## 2018-03-18 LAB — VITAMIN D 25 HYDROXY (VIT D DEFICIENCY, FRACTURES): VITD: 44.16 ng/mL (ref 30.00–100.00)

## 2018-03-18 NOTE — Progress Notes (Signed)
Subjective:  Patient ID: Miranda [name], female    DOB: 1986/04/13  Age: 32 y.o. MRN: [id number]  CC: Annual Exam and Anemia   HPI Miranda [name] presents for a CPX.  She continues to struggle with anemia, long heavy periods, fatigue, and shortness of breath.  She also complains of intermittent joint pain but tells me she does not have the money to pay the co-pay to see her rheumatologist.  She is controlling her pain with ibuprofen as needed.  Outpatient Medications Prior to Visit  Medication Sig Dispense Refill  . Calcium Carbonate (CALCIUM 600 PO) Take by mouth.    . Cholecalciferol 2000 UNITS TABS Take 1 tablet (2,000 Units total) by mouth daily. 90 tablet 3  . ferrous sulfate 325 (65 FE) MG tablet Take 1 tablet (325 mg total) by mouth 3 (three) times daily with meals. 90 tablet 11  . ibuprofen (ADVIL,MOTRIN) 200 MG tablet Take 400-800 mg by mouth every 6 (six) hours as needed.    . Multiple Vitamins-Iron (MULTIVITAMINS WITH IRON) TABS Take 1 tablet by mouth daily.     . risedronate (ACTONEL) 35 MG tablet Take 1 tablet (35 mg total) by mouth every 7 (seven) days. with water on empty stomach, nothing by mouth or lie down for next 30 minutes. (Patient not taking: Reported on 03/18/2018) 4 tablet 11   No facility-administered medications prior to visit.     ROS Review of Systems  Constitutional: Positive for fatigue. Negative for appetite change, diaphoresis and unexpected weight change.  HENT: Negative.   Eyes: Negative.  Negative for visual disturbance.  Respiratory: Positive for shortness of breath. Negative for cough, chest tightness, wheezing and stridor.   Cardiovascular: Negative for chest pain, palpitations and leg swelling.  Gastrointestinal: Negative for abdominal pain, blood in stool, constipation, diarrhea and vomiting.  Endocrine: Negative.   Genitourinary: Positive for menstrual problem (heavy periods). Negative for difficulty urinating, dysuria and hematuria.    Musculoskeletal: Positive for arthralgias. Negative for back pain, myalgias and neck pain.  Skin: Negative.  Negative for color change and rash.  Neurological: Negative.  Negative for dizziness, weakness, light-headedness, numbness and headaches.  Hematological: Negative for adenopathy. Does not bruise/bleed easily.  Psychiatric/Behavioral: Negative.     Objective:  BP 120/82 (BP Location: Left Arm, Patient Position: Sitting, Cuff Size: Normal)   Pulse 79   Temp 98.1 F (36.7 C) (Oral)   Resp 16   Ht 5\' 4"  (1.626 m)   Wt 125 lb (56.7 kg)   SpO2 99%   BMI 21.46 kg/m   BP Readings from Last 3 Encounters:  03/18/18 120/82  07/04/16 108/82  06/18/16 122/78    Wt Readings from Last 3 Encounters:  03/18/18 125 lb (56.7 kg)  07/04/16 136 lb (61.7 kg)  06/18/16 135 lb (61.2 kg)    Physical Exam  Constitutional: She is oriented to person, place, and time. No distress.  HENT:  Mouth/Throat: Oropharynx is clear and moist. No oropharyngeal exudate.  Eyes: Conjunctivae are normal. No scleral icterus.  Neck: Normal range of motion. Neck supple. No JVD present. No thyromegaly present.  Cardiovascular: Normal rate, regular rhythm and normal heart sounds. Exam reveals no gallop and no friction rub.  No murmur heard. Pulmonary/Chest: Effort normal and breath sounds normal. She has no decreased breath sounds. She has no wheezes. She has no rhonchi. She has no rales.  Abdominal: Soft. Bowel sounds are normal. She exhibits no mass. There is no hepatosplenomegaly. There is no tenderness.  No hernia.  Musculoskeletal: Normal range of motion. She exhibits no edema or deformity.       Right wrist: She exhibits tenderness and swelling. She exhibits normal range of motion, no bony tenderness, no effusion, no crepitus and no deformity.  There is mild erythema, tenderness, and swelling on the dorsum of the right wrist.  Lymphadenopathy:    She has no cervical adenopathy.  Neurological: She is alert  and oriented to person, place, and time.  Skin: Skin is warm and dry. She is not diaphoretic. No pallor.  Psychiatric: She has a normal mood and affect. Her behavior is normal. Judgment and thought content normal.  Vitals reviewed.   Lab Results  Component Value Date   WBC 6.9 03/18/2018   HGB 9.7 (L) 03/18/2018   HCT 29.7 (L) 03/18/2018   PLT 397.0 03/18/2018   GLUCOSE 93 07/04/2016   CHOL 161 03/18/2018   TRIG 65.0 03/18/2018   HDL 41.60 03/18/2018   LDLCALC 106 (H) 03/18/2018   ALT 7 07/04/2016   AST 14 07/04/2016   NA 136 07/04/2016   K 3.5 07/04/2016   CL 102 07/04/2016   CREATININE 0.50 07/04/2016   BUN 16 07/04/2016   CO2 25 07/04/2016   TSH 0.70 07/04/2016   HGBA1C 5.8 05/16/2015    Dg Bone Density  Result Date: 08/28/2015 Date of study: 08/22/15 Exam: DUAL X-RAY ABSORPTIOMETRY (DXA) FOR BONE MINERAL DENSITY (BMD) Instrument: [employer] Provider: PCP Indication: follow up for low BMD Comparison: none (please note that it is not possible to compare data from different instruments) Clinical data: Pt is a premenopausal 32 y.o. female with no previous h/o fracture. On calcium and vitamin D. Results:  Lumbar spine (L1-L4) Femoral neck (FN) 33% distal radius Z-score -0.8 RFN:-2.3 LFN:-2.4 n/a Change in BMD from previous DXA test (%) n/a n/a n/a (*) statistically significant Assessment: the BMD is low according to the [facility] classification for osteoporosis (see below). Fracture risk: moderate FRAX score: not calculated due to premenopausal status . Comments: the technical quality of the study is good. Evaluation for secondary causes should be considered if clinically indicated. Recommend optimizing calcium (1200 mg/day) and vitamin D (800 IU/day) intake. Followup: Repeat BMD is appropriate after 2 years or after 1-2 years if starting treatment. WHO criteria for diagnosis of osteoporosis in postmenopausal women and in men 79 y/o or older: - normal: T-score -1.0 to +  1.0 - osteopenia/low bone density: T-score between -2.5 and -1.0 - osteoporosis: T-score below -2.5 - severe osteoporosis: T-score below -2.5 with history of fragility fracture Note: although not part of the WHO classification, the presence of a fragility fracture, regardless of the T-score, should be considered diagnostic of osteoporosis, provided other causes for the fracture have been excluded. Treatment: The National Osteoporosis Foundation recommends that treatment be considered in postmenopausal women and men age 39 or older with: 1. Hip or vertebral (clinical or morphometric) fracture 2. T-score of - 2.5 or lower at the spine or hip 3. 10-year fracture probability by FRAX of at least 20% for a major osteoporotic fracture and 3% for a hip fracture Loura Pardon MD    Assessment & Plan:   Darsha was seen today for annual exam and anemia.  Diagnoses and all orders for this visit:  Vitamin D deficient osteomalacia- Her vitamin D level is in the normal range.  She will continue the current vitamin D supplementation. -     VITAMIN D 25 Hydroxy (Vit-D Deficiency, Fractures); Future  Iron deficiency anemia due to chronic blood loss- She remains anemic despite being compliant with oral iron intake.  I have asked her to return to hematology to consider iron infusions. -     CBC with Differential/Platelet; Future -     IBC panel; Future -     Ferritin; Future -     Ambulatory referral to Hematology  Routine general medical examination at a health care facility- Exam completed, labs reviewed, vaccines reviewed, Pap smear is up-to-date, patient education material was given. -     Lipid panel; Future  Menorrhagia with regular cycle- I have asked her to see GYN to consider options to treat the menorrhagia. -     Ambulatory referral to Gynecology   I have discontinued Charlaine Dalton M. Felker's risedronate. I am also having her maintain her multivitamins with iron, ibuprofen, Cholecalciferol, Calcium  Carbonate (CALCIUM 600 PO), and ferrous sulfate.  No orders of the defined types were placed in this encounter.    Follow-up: Return in about 6 months (around 09/17/2018).  Scarlette Calico, MD

## 2018-03-18 NOTE — Patient Instructions (Signed)
Preventive Care 18-39 Years, Female Preventive care refers to lifestyle choices and visits with your health care provider that can promote health and wellness. What does preventive care include?  A yearly physical exam. This is also called an annual well check.  Dental exams once or twice a year.  Routine eye exams. Ask your health care provider how often you should have your eyes checked.  Personal lifestyle choices, including: ? Daily care of your teeth and gums. ? Regular physical activity. ? Eating a healthy diet. ? Avoiding tobacco and drug use. ? Limiting alcohol use. ? Practicing safe sex. ? Taking vitamin and mineral supplements as recommended by your health care provider. What happens during an annual well check? The services and screenings done by your health care provider during your annual well check will depend on your age, overall health, lifestyle risk factors, and family history of disease. Counseling Your health care provider may ask you questions about your:  Alcohol use.  Tobacco use.  Drug use.  Emotional well-being.  Home and relationship well-being.  Sexual activity.  Eating habits.  Work and work Statistician.  Method of birth control.  Menstrual cycle.  Pregnancy history.  Screening You may have the following tests or measurements:  Height, weight, and BMI.  Diabetes screening. This is done by checking your blood sugar (glucose) after you have not eaten for a while (fasting).  Blood pressure.  Lipid and cholesterol levels. These may be checked every 5 years starting at age 66.  Skin check.  Hepatitis C blood test.  Hepatitis B blood test.  Sexually transmitted disease (STD) testing.  BRCA-related cancer screening. This may be done if you have a family history of breast, ovarian, tubal, or peritoneal cancers.  Pelvic exam and Pap test. This may be done every 3 years starting at age 40. Starting at age 59, this may be done every 5  years if you have a Pap test in combination with an HPV test.  Discuss your test results, treatment options, and if necessary, the need for more tests with your health care provider. Vaccines Your health care provider may recommend certain vaccines, such as:  Influenza vaccine. This is recommended every year.  Tetanus, diphtheria, and acellular pertussis (Tdap, Td) vaccine. You may need a Td booster every 10 years.  Varicella vaccine. You may need this if you have not been vaccinated.  HPV vaccine. If you are 69 or younger, you may need three doses over 6 months.  Measles, mumps, and rubella (MMR) vaccine. You may need at least one dose of MMR. You may also need a second dose.  Pneumococcal 13-valent conjugate (PCV13) vaccine. You may need this if you have certain conditions and were not previously vaccinated.  Pneumococcal polysaccharide (PPSV23) vaccine. You may need one or two doses if you smoke cigarettes or if you have certain conditions.  Meningococcal vaccine. One dose is recommended if you are age 27-21 years and a first-year college student living in a residence hall, or if you have one of several medical conditions. You may also need additional booster doses.  Hepatitis A vaccine. You may need this if you have certain conditions or if you travel or work in places where you may be exposed to hepatitis A.  Hepatitis B vaccine. You may need this if you have certain conditions or if you travel or work in places where you may be exposed to hepatitis B.  Haemophilus influenzae type b (Hib) vaccine. You may need this if  you have certain risk factors.  Talk to your health care provider about which screenings and vaccines you need and how often you need them. This information is not intended to replace advice given to you by your health care provider. Make sure you discuss any questions you have with your health care provider. Document Released: 11/06/2001 Document Revised: 05/30/2016  Document Reviewed: 07/12/2015 Elsevier Interactive Patient Education  [employer].

## 2018-10-01 ENCOUNTER — Ambulatory Visit (INDEPENDENT_AMBULATORY_CARE_PROVIDER_SITE_OTHER): Payer: 59 | Admitting: Obstetrics & Gynecology

## 2018-10-01 ENCOUNTER — Telehealth: Payer: Self-pay | Admitting: *Deleted

## 2018-10-01 ENCOUNTER — Encounter: Payer: Self-pay | Admitting: Obstetrics & Gynecology

## 2018-10-01 ENCOUNTER — Encounter: Payer: Self-pay | Admitting: *Deleted

## 2018-10-01 VITALS — BP 120/72

## 2018-10-01 DIAGNOSIS — N643 Galactorrhea not associated with childbirth: Secondary | ICD-10-CM

## 2018-10-01 NOTE — Progress Notes (Signed)
    JADELIN ENG [birth year] [id number]        33 y.o.  G0 Single  RP: Right clear nipple discharge x 01/2018  HPI: Spontaneous clear right nipple discharge on and off since May 2019.  No nipple or breast pain.  No drainage of blood from nipple.  No lump felt.  No change in skin.  Patient is not sexually active.  No breast stimulation or trauma.  Menstrual periods regular every month with normal flow.  No pelvic pain.   OB History  Gravida Para Term Preterm AB Living  0            SAB TAB Ectopic Multiple Live Births               Past medical history,surgical history, problem list, medications, allergies, family history and social history were all reviewed and documented in the EPIC chart.   Directed ROS with pertinent positives and negatives documented in the history of present illness/assessment and plan.  Exam:  Vitals:   10/01/18 0907  BP: 120/72   General appearance:  Normal  Breast exam: Left breast and axilla normal.  No discharge from the nipple of her pressure.  Right breast exam normal.  At right axilla normal.  Mild clear discharge after pressure on the nipple.  No bleeding.  No change in skin.   Assessment/Plan:  33 y.o. G0  1. Galactorrhea of right breast Right clear spontaneous galactorrhea since May 2019.  Breast exam otherwise normal.  Will verify a prolactin level.  Recommend no breast stimulation or nipple stimulation.  We will proceed with a right diagnostic mammogram and ultrasound.  Management per results. - Prolactin  Counseling on above issues and coordination of care more than 50% for 15 minutes. Princess Bruins MD, 9:15 AM 10/01/2018

## 2018-10-01 NOTE — Telephone Encounter (Signed)
Orders placed at breast center for imaging, I sent my chart message for patient to call and schedule.

## 2018-10-01 NOTE — Telephone Encounter (Signed)
-----   Message from Princess Bruins, MD sent at 10/01/2018  9:28 AM EST ----- Regarding: Schedule Rt Dx mammo/US Rt galactorrhea x 01/2018.

## 2018-10-02 LAB — PROLACTIN: PROLACTIN: 7.8 ng/mL

## 2018-10-02 NOTE — Telephone Encounter (Signed)
Patient scheduled on 10/06/18 @ 12:50pm at breast center, left message for pt to call

## 2018-10-03 NOTE — Telephone Encounter (Signed)
Patient informed. 

## 2018-10-05 ENCOUNTER — Encounter: Payer: Self-pay | Admitting: Obstetrics & Gynecology

## 2018-10-05 NOTE — Patient Instructions (Signed)
1. Galactorrhea of right breast Right clear spontaneous galactorrhea since May 2019.  Breast exam otherwise normal.  Will verify a prolactin level.  Recommend no breast stimulation or nipple stimulation.  We will proceed with a right diagnostic mammogram and ultrasound.  Management per results. - Prolactin  Kristyn, it was a pleasure meeting you today!  I will inform you of your results as soon as they are available.

## 2018-10-06 ENCOUNTER — Other Ambulatory Visit: Payer: Self-pay | Admitting: Obstetrics & Gynecology

## 2018-10-06 ENCOUNTER — Ambulatory Visit
Admission: RE | Admit: 2018-10-06 | Discharge: 2018-10-06 | Disposition: A | Discharge status: Home / Self Care | Payer: 59 | Source: Ambulatory Visit | Attending: Obstetrics & Gynecology | Admitting: Obstetrics & Gynecology

## 2018-10-06 DIAGNOSIS — N643 Galactorrhea not associated with childbirth: Secondary | ICD-10-CM

## 2018-10-06 DIAGNOSIS — N6489 Other specified disorders of breast: Secondary | ICD-10-CM | POA: Diagnosis not present

## 2018-10-06 DIAGNOSIS — R922 Inconclusive mammogram: Secondary | ICD-10-CM | POA: Diagnosis not present

## 2018-10-06 DIAGNOSIS — N6310 Unspecified lump in the right breast, unspecified quadrant: Secondary | ICD-10-CM

## 2018-10-07 ENCOUNTER — Other Ambulatory Visit: Payer: Self-pay | Admitting: Obstetrics & Gynecology

## 2018-10-08 ENCOUNTER — Other Ambulatory Visit: Payer: Self-pay | Admitting: Obstetrics & Gynecology

## 2018-10-08 ENCOUNTER — Other Ambulatory Visit: Payer: Self-pay

## 2018-10-08 ENCOUNTER — Ambulatory Visit
Admission: RE | Admit: 2018-10-08 | Discharge: 2018-10-08 | Disposition: A | Discharge status: Home / Self Care | Payer: 59 | Source: Ambulatory Visit | Attending: Obstetrics & Gynecology | Admitting: Obstetrics & Gynecology

## 2018-10-08 DIAGNOSIS — N643 Galactorrhea not associated with childbirth: Secondary | ICD-10-CM

## 2018-10-08 DIAGNOSIS — N6310 Unspecified lump in the right breast, unspecified quadrant: Secondary | ICD-10-CM

## 2018-10-08 DIAGNOSIS — D241 Benign neoplasm of right breast: Secondary | ICD-10-CM | POA: Diagnosis not present

## 2018-10-08 DIAGNOSIS — N6313 Unspecified lump in the right breast, lower outer quadrant: Secondary | ICD-10-CM | POA: Diagnosis not present

## 2018-10-08 DIAGNOSIS — N6315 Unspecified lump in the right breast, overlapping quadrants: Secondary | ICD-10-CM | POA: Diagnosis not present

## 2018-10-27 ENCOUNTER — Ambulatory Visit: Payer: Self-pay | Admitting: Surgery

## 2018-10-27 DIAGNOSIS — D241 Benign neoplasm of right breast: Secondary | ICD-10-CM | POA: Diagnosis not present

## 2018-10-27 NOTE — H&P (Signed)
Cecil Cobbs Documented: 10/27/2018 3:25 PM Location: [facility] Patient #: [id number] DOB: July 07, 1986 Single / Language: Cleophus Molt / Race: Black or African American Female  History of Present Illness Marcello Moores A. Almando Brawley MD; 10/27/2018 3:46 PM) Patient words: Patient sense at the request of Dr.Chacko with a six-month history of yellow right nipple discharge. This started about 6 months ago. It is intermittent. The drainage spell in nature. It is not blood-tinged. She underwent mammogram and ultrasound and core biopsy showed papilloma right breast. No history of previous surgery or , breast cancer/ family history cancer.           CLINICAL DATA: 33 year old female with spontaneous, yellow right nipple discharge for several months.  EXAM: DIGITAL DIAGNOSTIC BILATERAL MAMMOGRAM WITH CAD AND TOMO  ULTRASOUND RIGHT BREAST  COMPARISON: None.  ACR Breast Density Category c: The breast tissue is heterogeneously dense, which may obscure small masses.  FINDINGS: No suspicious mammographic findings are identified in either breast.  Mammographic images were processed with CAD.  On physical exam, I am able to elicit yellow nipple discharge from a single duct centrally on the right.  Targeted ultrasound is performed, showing numerous minimally dilated ducts in the subareolar region. A single duct at the 9 o'clock retroareolar region demonstrates internal solid component with associated vascularity. It measures approximately 2 x 3 mm in spans approximately 2 cm within the duct along the radial projection. Evaluation of the right axilla demonstrates no suspicious lymphadenopathy.  IMPRESSION: 1. Indeterminate intraductal mass with associated vascularity at the 9 o'clock retroareolar position on the right. This may relate to the patient's spontaneous nipple discharge. Recommendation is for ultrasound-guided biopsy. 2. No suspicious right axillary  lymphadenopathy. 3. No mammographic evidence of malignancy on the left.  RECOMMENDATION: Ultrasound-guided biopsy of the right breast.  I have discussed the findings and recommendations with the patient. Results were also provided in writing at the conclusion of the visit. If applicable, a reminder letter will be sent to the patient regarding the next appointment.  BI-RADS CATEGORY 4: Suspicious.   Electronically Signed By: Kristopher Oppenheim M.D. On: 10/06/2018 14:28         Diagnosis Breast, right, needle core biopsy, 8:30 o'clock, ribbon clip - DUCTAL PAPILLOMA WITH USUAL DUCTAL HYPERPLASIA. - SEE MICROSCOPIC DESCRIPTION.  The patient is a 33 year old female.   Past Surgical History Nance Pew, CMA; 10/27/2018 3:25 PM) Breast Biopsy Right.  Diagnostic Studies History Nance Pew, [place]; 10/27/2018 3:25 PM) Colonoscopy never Mammogram 1-3 years ago  Allergies Nance Pew, CMA; 10/27/2018 3:29 PM) No Known Allergies [10/27/2018]: No Known Drug Allergies [10/27/2018]: Allergies Reconciled  Medication History Nance Pew, CMA; 10/27/2018 3:29 PM) No Current Medications Medications Reconciled  Social History Nance Pew, CMA; 10/27/2018 3:25 PM) No alcohol use No caffeine use No drug use Tobacco use Never smoker.  Family History Nance Pew, [place]; 10/27/2018 3:25 PM) Diabetes Mellitus Father.  Pregnancy / Birth History Nance Pew, [place]; 10/27/2018 3:25 PM) Age at menarche 59 years. Gravida 0 Para 0  Other Problems Nance Pew, CMA; 10/27/2018 3:25 PM) Arthritis Back Pain     Review of Systems ([place]; 10/27/2018 3:25 PM) General Present- Chills and Fatigue. Not Present- Appetite Loss, Fever, Night Sweats, Weight Gain and Weight Loss. Skin Present- Dryness. Not Present- Change in Wart/Mole, Hives, Jaundice, New Lesions, Non-Healing Wounds, Rash and Ulcer. HEENT Present- Wears glasses/contact lenses. Not Present-  Earache, Hearing Loss, Hoarseness, Nose Bleed, Oral Ulcers, Ringing in the Ears, Seasonal Allergies, Sinus Pain,  Sore Throat, Visual Disturbances and Yellow Eyes. Respiratory Present- Snoring. Not Present- Bloody sputum, Chronic Cough, Difficulty Breathing and Wheezing. Breast Present- Nipple Discharge. Not Present- Breast Mass, Breast Pain and Skin Changes. Cardiovascular Present- Shortness of Breath. Not Present- Chest Pain, Difficulty Breathing Lying Down, Leg Cramps, Palpitations, Rapid Heart Rate and Swelling of Extremities. Gastrointestinal Present- Difficulty Swallowing. Not Present- Abdominal Pain, Bloating, Bloody Stool, Change in Bowel Habits, Chronic diarrhea, Constipation, Excessive gas, Gets full quickly at meals, Hemorrhoids, Indigestion, Nausea, Rectal Pain and Vomiting. Musculoskeletal Present- Joint Pain and Muscle Weakness. Not Present- Back Pain, Joint Stiffness, Muscle Pain and Swelling of Extremities. Hematology Not Present- Blood Thinners, Easy Bruising, Excessive bleeding, Gland problems, HIV and Persistent Infections.  Vitals (Sabrina Canty CMA; 10/27/2018 3:33 PM) 10/27/2018 3:29 PM Weight: 126.5 lb Height: 64in Body Surface Area: 1.61 m Body Mass Index: 21.71 kg/m  Temp.: 98.86F(Temporal)  Pulse: 86 (Regular)  P.OX: 98% (Room air) BP: 138/72 (Sitting, Left Arm, Standard)      Physical Exam (Alyssamarie Mounsey A. Louellen Haldeman MD; 10/27/2018 3:47 PM)  General Mental Status-Alert. General Appearance-Consistent with stated age. Hydration-Well hydrated. Voice-Normal.  Eye Eyeball - Bilateral-Extraocular movements intact. Sclera/Conjunctiva - Bilateral-No scleral icterus.  Chest and Lung Exam Chest and lung exam reveals -quiet, even and easy respiratory effort with no use of accessory muscles and on auscultation, normal breath sounds, no adventitious sounds and normal vocal resonance. Inspection Chest Wall - Normal. Back - normal.  Breast Breast -  Left-Symmetric, Non Tender, No Biopsy scars, no Dimpling, No Inflammation, No Lumpectomy scars, No Mastectomy scars, No Peau d' Orange. Breast - Right-Symmetric, Non Tender, No Biopsy scars, no Dimpling, No Inflammation, No Lumpectomy scars, No Mastectomy scars, No Peau d' Orange. Breast Lump-No Palpable Breast Mass.  Cardiovascular Cardiovascular examination reveals -normal heart sounds, regular rate and rhythm with no murmurs and normal pedal pulses bilaterally.  Neurologic Neurologic evaluation reveals -alert and oriented x 3 with no impairment of recent or remote memory. Mental Status-Normal.  Musculoskeletal Normal Exam - Left-Upper Extremity Strength Normal and Lower Extremity Strength Normal. Normal Exam - Right-Upper Extremity Strength Normal and Lower Extremity Strength Normal.  Lymphatic Head & Neck  General Head & Neck Lymphatics: Bilateral - Description - Normal. Axillary  General Axillary Region: Bilateral - Description - Normal. Tenderness - Non Tender.    Assessment & Plan (Jozelynn Danielson A. Loron Weimer MD; 10/27/2018 3:47 PM)  PAPILLOMA OF RIGHT BREAST (D24.1) Impression: Discussed pros and cons of excision versus observation. Discussed potential malignant risk of up to 20%. She agrees to proceed with a right breast lumpectomy. Risk of lumpectomy include bleeding, infection, seroma, more surgery, use of seed/wire, wound care, cosmetic deformity and the need for other treatments, death , blood clots, death. Pt agrees to proceed.  Current Plans You are being scheduled for surgery- Our schedulers will call you.  You should hear from our office's scheduling department within 5 working days about the location, date, and time of surgery. We try to make accommodations for patient's preferences in scheduling surgery, but sometimes the OR schedule or the surgeon's schedule prevents [place] from making those accommodations.  If you have not heard from our office  [phone]) in 5 working days, call the office and ask for your surgeon's nurse.  If you have other questions about your diagnosis, plan, or surgery, call the office and ask for your surgeon's nurse.  Pt Education - Pamphlet Given - Breast Biopsy: discussed with patient and provided information.

## 2018-12-08 ENCOUNTER — Ambulatory Visit (INDEPENDENT_AMBULATORY_CARE_PROVIDER_SITE_OTHER): Payer: 59 | Admitting: Obstetrics & Gynecology

## 2018-12-08 ENCOUNTER — Encounter: Payer: Self-pay | Admitting: Obstetrics & Gynecology

## 2018-12-08 ENCOUNTER — Other Ambulatory Visit: Payer: Self-pay

## 2018-12-08 VITALS — BP 126/82 | Ht 64.0 in | Wt 127.0 lb

## 2018-12-08 DIAGNOSIS — N920 Excessive and frequent menstruation with regular cycle: Secondary | ICD-10-CM

## 2018-12-08 DIAGNOSIS — D649 Anemia, unspecified: Secondary | ICD-10-CM

## 2018-12-08 DIAGNOSIS — Z01419 Encounter for gynecological examination (general) (routine) without abnormal findings: Secondary | ICD-10-CM

## 2018-12-08 DIAGNOSIS — Z3009 Encounter for other general counseling and advice on contraception: Secondary | ICD-10-CM | POA: Diagnosis not present

## 2018-12-08 LAB — CBC
HCT: 29.9 % — ABNORMAL LOW (ref 35.0–45.0)
Hemoglobin: 9.5 g/dL — ABNORMAL LOW (ref 11.7–15.5)
MCH: 25.5 pg — ABNORMAL LOW (ref 27.0–33.0)
MCHC: 31.8 g/dL — AB (ref 32.0–36.0)
MCV: 80.4 fL (ref 80.0–100.0)
MPV: 10 fL (ref 7.5–12.5)
Platelets: 409 10*3/uL — ABNORMAL HIGH (ref 140–400)
RBC: 3.72 10*6/uL — ABNORMAL LOW (ref 3.80–5.10)
RDW: 14.9 % (ref 11.0–15.0)
WBC: 7.8 10*3/uL (ref 3.8–10.8)

## 2018-12-08 LAB — HM PAP SMEAR

## 2018-12-08 NOTE — Progress Notes (Signed)
Miranda [name] Miranda [name] 1986/02/25 [id number]   History:    33 y.o. G0 Single.  Works for [employer].  RP:  Established patient presenting for annual gyn exam   HPI: Menses regular every month with long standing heavy flow with secondary anemia.  Last hemoglobin June 2019 was at 9.7.  Patient is taking iron sulfate supplements twice a day.  No pelvic pain.  Normal vaginal secretions.  Abstinent. Urine and bowel movements normal.  Breast currently normal.  Had galacturia in January 2024 which evaluated her.  Sent for a bilateral diagnostic mammogram.  Right breast mammogram was suspicious, a right breast biopsy was done on 10/08/2018, showing a ductal papilloma with usual ductal hyperplasia, seen by general surgery Dr. Erroll Luna, patient chose to observe rather than excise.  No longer having galactorrhea.  No breast pain or breast lump felt.  Body mass index 21.8.  Not very physically active.  Health labs with family physician.   Past medical history,surgical history, family history and social history were all reviewed and documented in the EPIC chart.  Gynecologic History Patient's last menstrual period was 11/13/2018. Contraception: abstinence Last Pap: 04/2015. Results were: Negative/HPV HR neg Last mammogram: 10/06/2018. Results were: Suspicious on Rt Breast.  Rt Breast Bx Ductal papilloma with usual ductal hyperplasia. Bone Density: Never Colonoscopy: Never  Obstetric History OB History  Gravida Para Term Preterm AB Living  0            SAB TAB Ectopic Multiple Live Births                ROS: A ROS was performed and pertinent positives and negatives are included in the history.  GENERAL: No fevers or chills. HEENT: No change in vision, no earache, sore throat or sinus congestion. NECK: No pain or stiffness. CARDIOVASCULAR: No chest pain or pressure. No palpitations. PULMONARY: No shortness of breath, cough or wheeze. GASTROINTESTINAL: No abdominal pain, nausea, vomiting or  diarrhea, melena or bright red blood per rectum. GENITOURINARY: No urinary frequency, urgency, hesitancy or dysuria. MUSCULOSKELETAL: No joint or muscle pain, no back pain, no recent trauma. DERMATOLOGIC: No rash, no itching, no lesions. ENDOCRINE: No polyuria, polydipsia, no heat or cold intolerance. No recent change in weight. HEMATOLOGICAL: No anemia or easy bruising or bleeding. NEUROLOGIC: No headache, seizures, numbness, tingling or weakness. PSYCHIATRIC: No depression, no loss of interest in normal activity or change in sleep pattern.     Exam:   Ht 5\' 4"  (1.626 m)   Wt 127 lb (57.6 kg)   LMP 11/13/2018   BMI 21.80 kg/m   Body mass index is 21.8 kg/m.  General appearance : Well developed well nourished female. No acute distress HEENT: Eyes: no retinal hemorrhage or exudates,  Neck supple, trachea midline, no carotid bruits, no thyroidmegaly Lungs: Clear to auscultation, no rhonchi or wheezes, or rib retractions  Heart: Regular rate and rhythm, no murmurs or gallops Breast:Examined in sitting and supine position were symmetrical in appearance, no palpable masses or tenderness,  no skin retraction, no nipple inversion, no nipple discharge, no skin discoloration, no axillary or supraclavicular lymphadenopathy Abdomen: no palpable masses or tenderness, no rebound or guarding Extremities: no edema or skin discoloration or tenderness  Pelvic: Vulva: Normal             Vagina: No gross lesions or discharge  Cervix: No gross lesions or discharge.  Pap reflex done.  Uterus  AV, normal size, shape and consistency, non-tender and mobile  Adnexa  Without masses or tenderness  Anus: Normal   Assessment/Plan:  33 y.o. female for annual exam   1. Encounter for routine gynecological examination with Papanicolaou smear of cervix Normal gynecologic exam.  Pap reflex done.  Breast exam normal.  Diagnostic mammogram for galacturia in January 2020 was abnormal on the right side.  Right breast  biopsy showed a ductal papilloma with usual ductal hyperplasia.  Patient decided to observe.  Body mass index 21.8.  Recommend aerobic activities 5 times a week and weightlifting every 2 days.  Continue with healthy nutrition.  Health labs with family physician.  2. Encounter for other general counseling or advice on contraception Abstinence.    3. Menorrhagia with regular cycle Longstanding heavy periods with secondary anemia.  Recommend controlling her menstrual periods with a progestin contraceptive.  IUD with progesterone or progestin only birth control pill recommended to patient.  Declined by patient at this time, will call back if decides to proceed with either.  We will check a CBC today and make recommendations accordingly. - CBC  4. Secondary anemia As above. - CBC  Counseling on above issues and coordination of care more than 50% for 10 minutes.  Princess Bruins MD, 2:15 PM 12/08/2018

## 2018-12-08 NOTE — Patient Instructions (Signed)
1. Encounter for routine gynecological examination with Papanicolaou smear of cervix Normal gynecologic exam.  Pap reflex done.  Breast exam normal.  Diagnostic mammogram for galacturia in January 2020 was abnormal on the right side.  Right breast biopsy showed a ductal papilloma with usual ductal hyperplasia.  Patient decided to observe.  Body mass index 21.8.  Recommend aerobic activities 5 times a week and weightlifting every 2 days.  Continue with healthy nutrition.  Health labs with family physician.  2. Encounter for other general counseling or advice on contraception Abstinence.    3. Menorrhagia with regular cycle Longstanding heavy periods with secondary anemia.  Recommend controlling her menstrual periods with a progestin contraceptive.  IUD with progesterone or progestin only birth control pill recommended to patient.  Declined by patient at this time, will call back if decides to proceed with either.  We will check a CBC today and make recommendations accordingly. - CBC  4. Secondary anemia As above. - CBC  Miranda [name], it was a pleasure seeing you today!  I will inform you of your results as soon as they are available.

## 2018-12-09 LAB — PAP IG W/ RFLX HPV ASCU

## 2018-12-18 ENCOUNTER — Encounter: Payer: Self-pay | Admitting: *Deleted

## 2019-02-27 ENCOUNTER — Encounter: Payer: Self-pay | Admitting: Internal Medicine

## 2019-03-10 ENCOUNTER — Encounter: Payer: Self-pay | Admitting: Internal Medicine

## 2019-03-10 ENCOUNTER — Other Ambulatory Visit: Payer: Self-pay

## 2019-03-10 ENCOUNTER — Ambulatory Visit (INDEPENDENT_AMBULATORY_CARE_PROVIDER_SITE_OTHER): Payer: 59 | Admitting: Internal Medicine

## 2019-03-10 VITALS — BP 120/88 | HR 81 | Temp 98.4°F | Resp 16 | Ht 64.0 in | Wt 129.0 lb

## 2019-03-10 DIAGNOSIS — M839 Adult osteomalacia, unspecified: Secondary | ICD-10-CM

## 2019-03-10 DIAGNOSIS — Z0000 Encounter for general adult medical examination without abnormal findings: Secondary | ICD-10-CM | POA: Diagnosis not present

## 2019-03-10 DIAGNOSIS — D500 Iron deficiency anemia secondary to blood loss (chronic): Secondary | ICD-10-CM

## 2019-03-10 NOTE — Progress Notes (Signed)
Subjective:  Patient ID: Miranda [name], female    DOB: 1986/08/27  Age: 33 y.o. MRN: [id number]  CC: Anemia and Annual Exam   HPI Miranda [name] presents for a CPX.  She continues to complain of arthralgias, fatigue, shortness of breath, and heavy menstrual cycles that last 4 days.  She saw a gynecologist about 3 to 4 months ago and it was recommended that she take a contraceptive to treat the heavy menstruation but she is not willing to do that.  She tells me she is compliant with the iron supplement.  She is not willing to see a rheumatologist because she does not think it will help her.  She is controlling her pain with ibuprofen.  Outpatient Medications Prior to Visit  Medication Sig Dispense Refill   Calcium Carbonate (CALCIUM 600 PO) Take by mouth.     Cholecalciferol 2000 UNITS TABS Take 1 tablet (2,000 Units total) by mouth daily. 90 tablet 3   ferrous sulfate 325 (65 FE) MG tablet Take 1 tablet (325 mg total) by mouth 3 (three) times daily with meals. 90 tablet 11   ibuprofen (ADVIL,MOTRIN) 200 MG tablet Take 400-800 mg by mouth every 6 (six) hours as needed.     Multiple Vitamins-Iron (MULTIVITAMINS WITH IRON) TABS Take 1 tablet by mouth daily.      No facility-administered medications prior to visit.     ROS Review of Systems  Constitutional: Positive for fatigue. Negative for appetite change, chills, diaphoresis, fever and unexpected weight change.  HENT: Negative.   Eyes: Negative for visual disturbance.  Respiratory: Negative.  Negative for cough, chest tightness, shortness of breath and wheezing.   Cardiovascular: Negative for chest pain, palpitations and leg swelling.  Gastrointestinal: Negative for abdominal pain, constipation, diarrhea, nausea and vomiting.  Endocrine: Negative.   Genitourinary: Negative.  Negative for difficulty urinating and hematuria.  Musculoskeletal: Positive for arthralgias. Negative for back pain and myalgias.  Skin: Positive for  pallor. Negative for color change.  Neurological: Negative.  Negative for dizziness, weakness and light-headedness.  Hematological: Negative for adenopathy. Does not bruise/bleed easily.  Psychiatric/Behavioral: Negative.     Objective:  BP 120/88    Pulse 81    Temp 98.4 F (36.9 C) (Oral)    Resp 16    Ht '5\' 4"'  (1.626 m)    Wt 129 lb (58.5 kg)    SpO2 96%    BMI 22.14 kg/m   BP Readings from Last 3 Encounters:  03/10/19 120/88  12/08/18 126/82  10/01/18 120/72    Wt Readings from Last 3 Encounters:  03/10/19 129 lb (58.5 kg)  12/08/18 127 lb (57.6 kg)  03/18/18 125 lb (56.7 kg)    Physical Exam Vitals signs reviewed.  Constitutional:      Appearance: She is not ill-appearing or diaphoretic.  HENT:     Nose: Nose normal. No rhinorrhea.     Mouth/Throat:     Mouth: Mucous membranes are moist.     Pharynx: No posterior oropharyngeal erythema.  Eyes:     General: No scleral icterus.    Conjunctiva/sclera: Conjunctivae normal.  Neck:     Musculoskeletal: Normal range of motion and neck supple. No muscular tenderness.  Cardiovascular:     Rate and Rhythm: Normal rate and regular rhythm.     Heart sounds: No murmur. No gallop.   Pulmonary:     Effort: Pulmonary effort is normal.     Breath sounds: No stridor. No wheezing, rhonchi or rales.  Abdominal:     General: Abdomen is flat.     Palpations: There is no hepatomegaly, splenomegaly or mass.     Tenderness: There is no abdominal tenderness.  Musculoskeletal: Normal range of motion.        General: No swelling or tenderness.     Right lower leg: No edema.     Left lower leg: No edema.  Lymphadenopathy:     Cervical: No cervical adenopathy.  Skin:    General: Skin is warm.     Coloration: Skin is pale. Skin is not jaundiced.     Findings: No rash.  Neurological:     General: No focal deficit present.  Psychiatric:        Mood and Affect: Mood normal.        Behavior: Behavior normal.     Lab Results   Component Value Date   WBC 7.8 12/08/2018   HGB 9.5 (L) 12/08/2018   HCT 29.9 (L) 12/08/2018   PLT 409 (H) 12/08/2018   GLUCOSE 93 07/04/2016   CHOL 161 03/18/2018   TRIG 65.0 03/18/2018   HDL 41.60 03/18/2018   LDLCALC 106 (H) 03/18/2018   ALT 7 07/04/2016   AST 14 07/04/2016   NA 136 07/04/2016   K 3.5 07/04/2016   CL 102 07/04/2016   CREATININE 0.50 07/04/2016   BUN 16 07/04/2016   CO2 25 07/04/2016   TSH 0.70 07/04/2016   HGBA1C 5.8 05/16/2015    Mm Clip Placement Right  Result Date: 10/08/2018 CLINICAL DATA:  Status post ultrasound-guided core biopsy of intraductal mass in the 8:30 o'clock location of the RIGHT breast. EXAM: DIAGNOSTIC RIGHT MAMMOGRAM POST ULTRASOUND BIOPSY COMPARISON:  Previous exam(s). FINDINGS: Mammographic images were obtained following ultrasound guided biopsy of intraductal mass in the 8:30 o'clock location of the RIGHT breast and placement of a ribbon shaped clip. The clip is identified in the LATERAL anterior portion of the RIGHT breast, as expected. IMPRESSION: Tissue marker clip in the expected location following biopsy. Final Assessment: Post Procedure Mammograms for Marker Placement Electronically Signed   By: Nolon Nations M.D.   On: 10/08/2018 16:48   [place] Rt Breast Bx W Loc Dev 1st Lesion Img Bx Spec US Guide  Addendum Date: 10/09/2018   ADDENDUM REPORT: 10/09/2018 14:22 ADDENDUM: Pathology revealed DUCTAL PAPILLOMA WITH USUAL DUCTAL HYPERPLASIA of the Right breast, 8:30 o'clock, (ribbon clip). This was found to be concordant by Dr. Nolon Nations, with excision recommended. Pathology results were discussed with the patient by telephone. The patient reported doing well after the biopsy with tenderness at the site. Post biopsy instructions and care were reviewed and questions were answered. The patient was encouraged to call [facility] for any additional concerns. Surgical consultation has been arranged with Dr. Erroll Luna at [facility] on October 17, 2018. Pathology results reported by Terie Purser, RN on 10/09/2018. Electronically Signed   By: Nolon Nations M.D.   On: 10/09/2018 14:22   Result Date: 10/09/2018 CLINICAL DATA:  Patient presents for ultrasound-guided core biopsy of an intraductal mass in the 3 o'clock location of the RIGHT breast. EXAM: ULTRASOUND GUIDED RIGHT BREAST CORE NEEDLE BIOPSY COMPARISON:  Previous exam(s). FINDINGS: I met with the patient and we discussed the procedure of ultrasound-guided biopsy, including benefits and alternatives. We discussed the high likelihood of a successful procedure. We discussed the risks of the procedure, including infection, bleeding, tissue injury, clip migration, and inadequate sampling. Informed written consent was  given. The usual time-out protocol was performed immediately prior to the procedure. Lesion quadrant: LOWER OUTER QUADRANT RIGHT breast Using sterile technique and 1% Lidocaine as local anesthetic, under direct ultrasound visualization, a 12 gauge spring-loaded device was used to perform biopsy of intraductal mass in the 8:30 o'clock location of the RIGHT breast 4 centimeters from nipple using a inferior to superior approach. At the conclusion of the procedure a ribbon shaped tissue marker clip was deployed into the biopsy cavity. Follow up 2 view mammogram was performed and dictated separately. IMPRESSION: Ultrasound guided biopsy of RIGHT breast mass. No apparent complications. Electronically Signed: By: Nolon Nations M.D. On: 10/08/2018 16:49    Assessment & Plan:   Miranda [name] was seen today for anemia and annual exam.  Diagnoses and all orders for this visit:  Vitamin D deficient osteomalacia- I will monitor her vitamin D level and will recommend a supplement if indicated. -     VITAMIN D 25 Hydroxy (Vit-D Deficiency, Fractures); Future  Iron deficiency anemia due to chronic blood loss- I will recheck her H&H and her iron  level.  If she continues to be iron deficient and anemic I will recommend that she undergo an iron infusion. -     CBC with Differential/Platelet; Future -     IBC panel; Future -     Ferritin; Future  Routine general medical examination at a health care facility- Exam completed, labs ordered, vaccines reviewed, Pap smear is up-to-date, patient education material was given. -     HIV Antibody (routine testing w rflx); Future   I am having Miranda [name] maintain her multivitamins with iron, ibuprofen, Cholecalciferol, Calcium Carbonate (CALCIUM 600 PO), and ferrous sulfate.  No orders of the defined types were placed in this encounter.    Follow-up: Return in about 6 months (around 09/09/2019).  Scarlette Calico, MD

## 2019-03-10 NOTE — Patient Instructions (Signed)
Preventive Care 18-39 Years, Female Preventive care refers to lifestyle choices and visits with your health care provider that can promote health and wellness. What does preventive care include?   A yearly physical exam. This is also called an annual well check.  Dental exams once or twice a year.  Routine eye exams. Ask your health care provider how often you should have your eyes checked.  Personal lifestyle choices, including: ? Daily care of your teeth and gums. ? Regular physical activity. ? Eating a healthy diet. ? Avoiding tobacco and drug use. ? Limiting alcohol use. ? Practicing safe sex. ? Taking vitamin and mineral supplements as recommended by your health care provider. What happens during an annual well check? The services and screenings done by your health care provider during your annual well check will depend on your age, overall health, lifestyle risk factors, and family history of disease. Counseling Your health care provider may ask you questions about your:  Alcohol use.  Tobacco use.  Drug use.  Emotional well-being.  Home and relationship well-being.  Sexual activity.  Eating habits.  Work and work environment.  Method of birth control.  Menstrual cycle.  Pregnancy history. Screening You may have the following tests or measurements:  Height, weight, and BMI.  Diabetes screening. This is done by checking your blood sugar (glucose) after you have not eaten for a while (fasting).  Blood pressure.  Lipid and cholesterol levels. These may be checked every 5 years starting at age 20.  Skin check.  Hepatitis C blood test.  Hepatitis B blood test.  Sexually transmitted disease (STD) testing.  BRCA-related cancer screening. This may be done if you have a family history of breast, ovarian, tubal, or peritoneal cancers.  Pelvic exam and Pap test. This may be done every 3 years starting at age 21. Starting at age 30, this may be done every 5  years if you have a Pap test in combination with an HPV test. Discuss your test results, treatment options, and if necessary, the need for more tests with your health care provider. Vaccines Your health care provider may recommend certain vaccines, such as:  Influenza vaccine. This is recommended every year.  Tetanus, diphtheria, and acellular pertussis (Tdap, Td) vaccine. You may need a Td booster every 10 years.  Varicella vaccine. You may need this if you have not been vaccinated.  HPV vaccine. If you are 26 or younger, you may need three doses over 6 months.  Measles, mumps, and rubella (MMR) vaccine. You may need at least one dose of MMR. You may also need a second dose.  Pneumococcal 13-valent conjugate (PCV13) vaccine. You may need this if you have certain conditions and were not previously vaccinated.  Pneumococcal polysaccharide (PPSV23) vaccine. You may need one or two doses if you smoke cigarettes or if you have certain conditions.  Meningococcal vaccine. One dose is recommended if you are age 19-21 years and a first-year college student living in a residence hall, or if you have one of several medical conditions. You may also need additional booster doses.  Hepatitis A vaccine. You may need this if you have certain conditions or if you travel or work in places where you may be exposed to hepatitis A.  Hepatitis B vaccine. You may need this if you have certain conditions or if you travel or work in places where you may be exposed to hepatitis B.  Haemophilus influenzae type b (Hib) vaccine. You may need this if you   have certain risk factors. Talk to your health care provider about which screenings and vaccines you need and how often you need them. This information is not intended to replace advice given to you by your health care provider. Make sure you discuss any questions you have with your health care provider. Document Released: 11/06/2001 Document Revised: 04/23/2017  Document Reviewed: 07/12/2015 Elsevier Interactive Patient Education  2019 [employer].

## 2019-05-04 ENCOUNTER — Encounter: Payer: 59 | Admitting: Internal Medicine

## 2019-06-15 ENCOUNTER — Ambulatory Visit: Payer: 59

## 2020-06-11 IMAGING — MG MM CLIP PLACEMENT
2 series · 2 of 2 positions shown · non-contrast
Comparison: Previous exam(s).

CLINICAL DATA: Status post ultrasound-guided core biopsy of
intraductal mass in the 8:30 o'clock location of the RIGHT breast.

EXAM:
DIAGNOSTIC RIGHT MAMMOGRAM POST ULTRASOUND BIOPSY

[R ML]
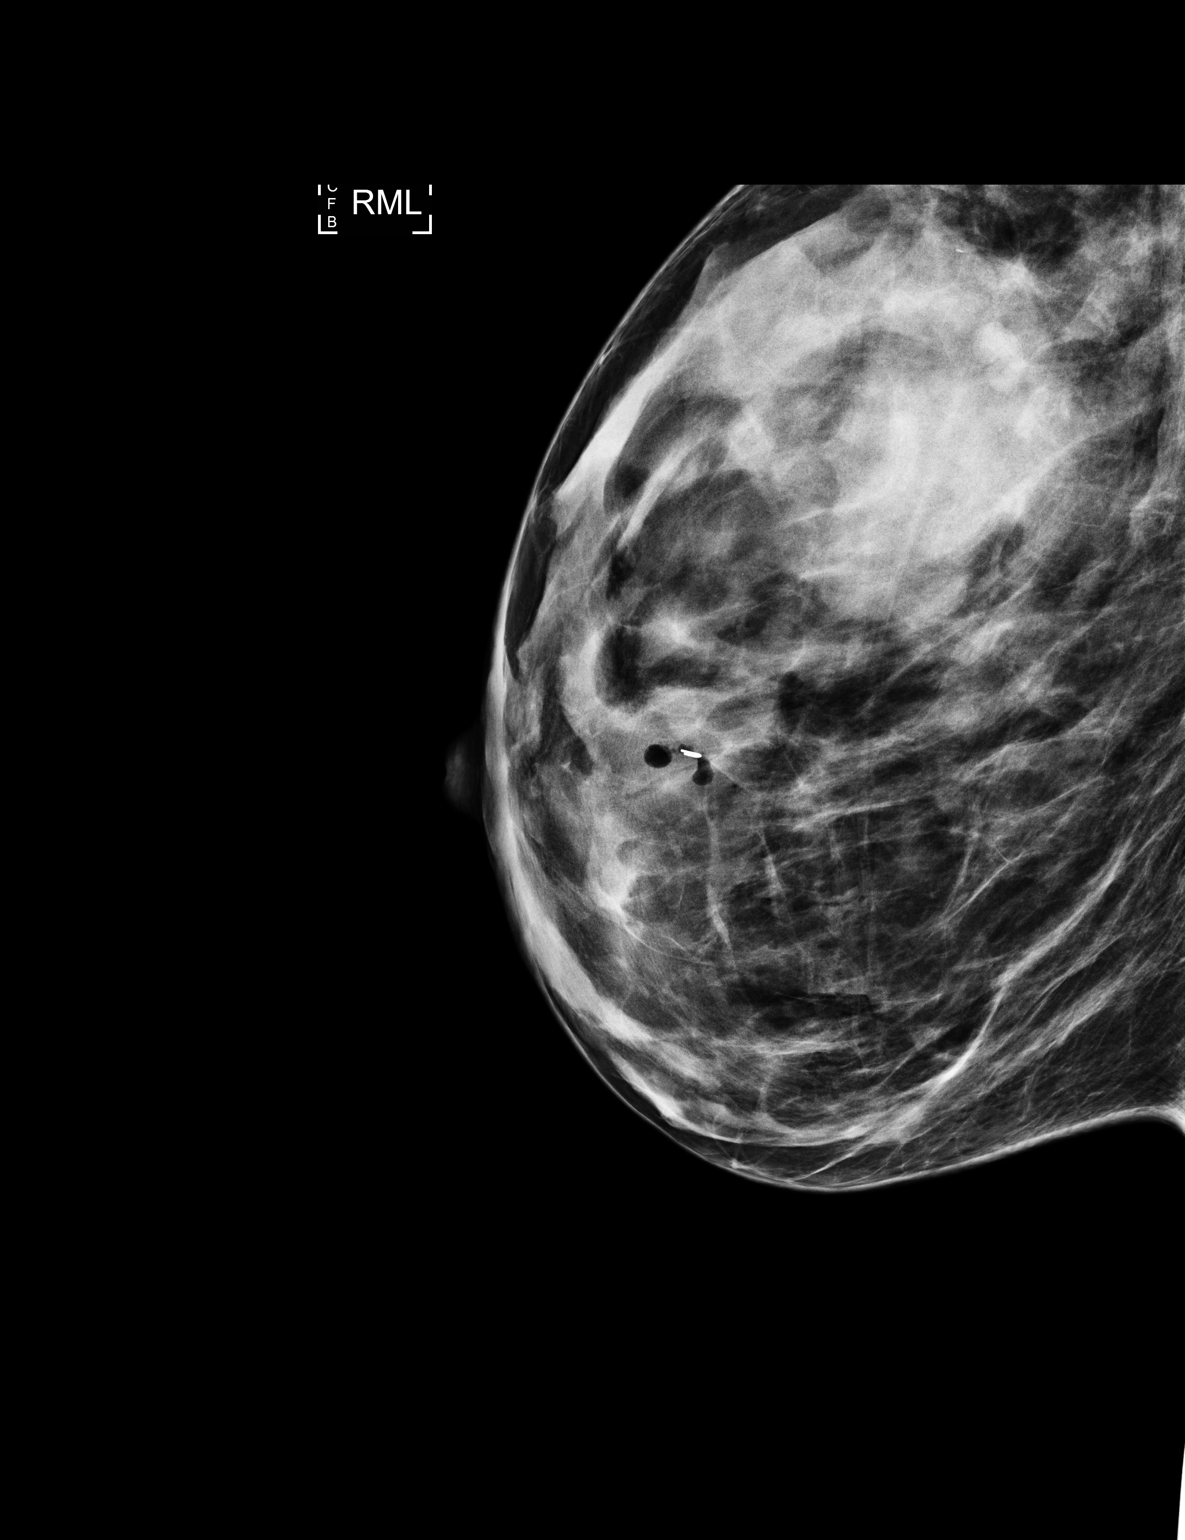

[R CC]
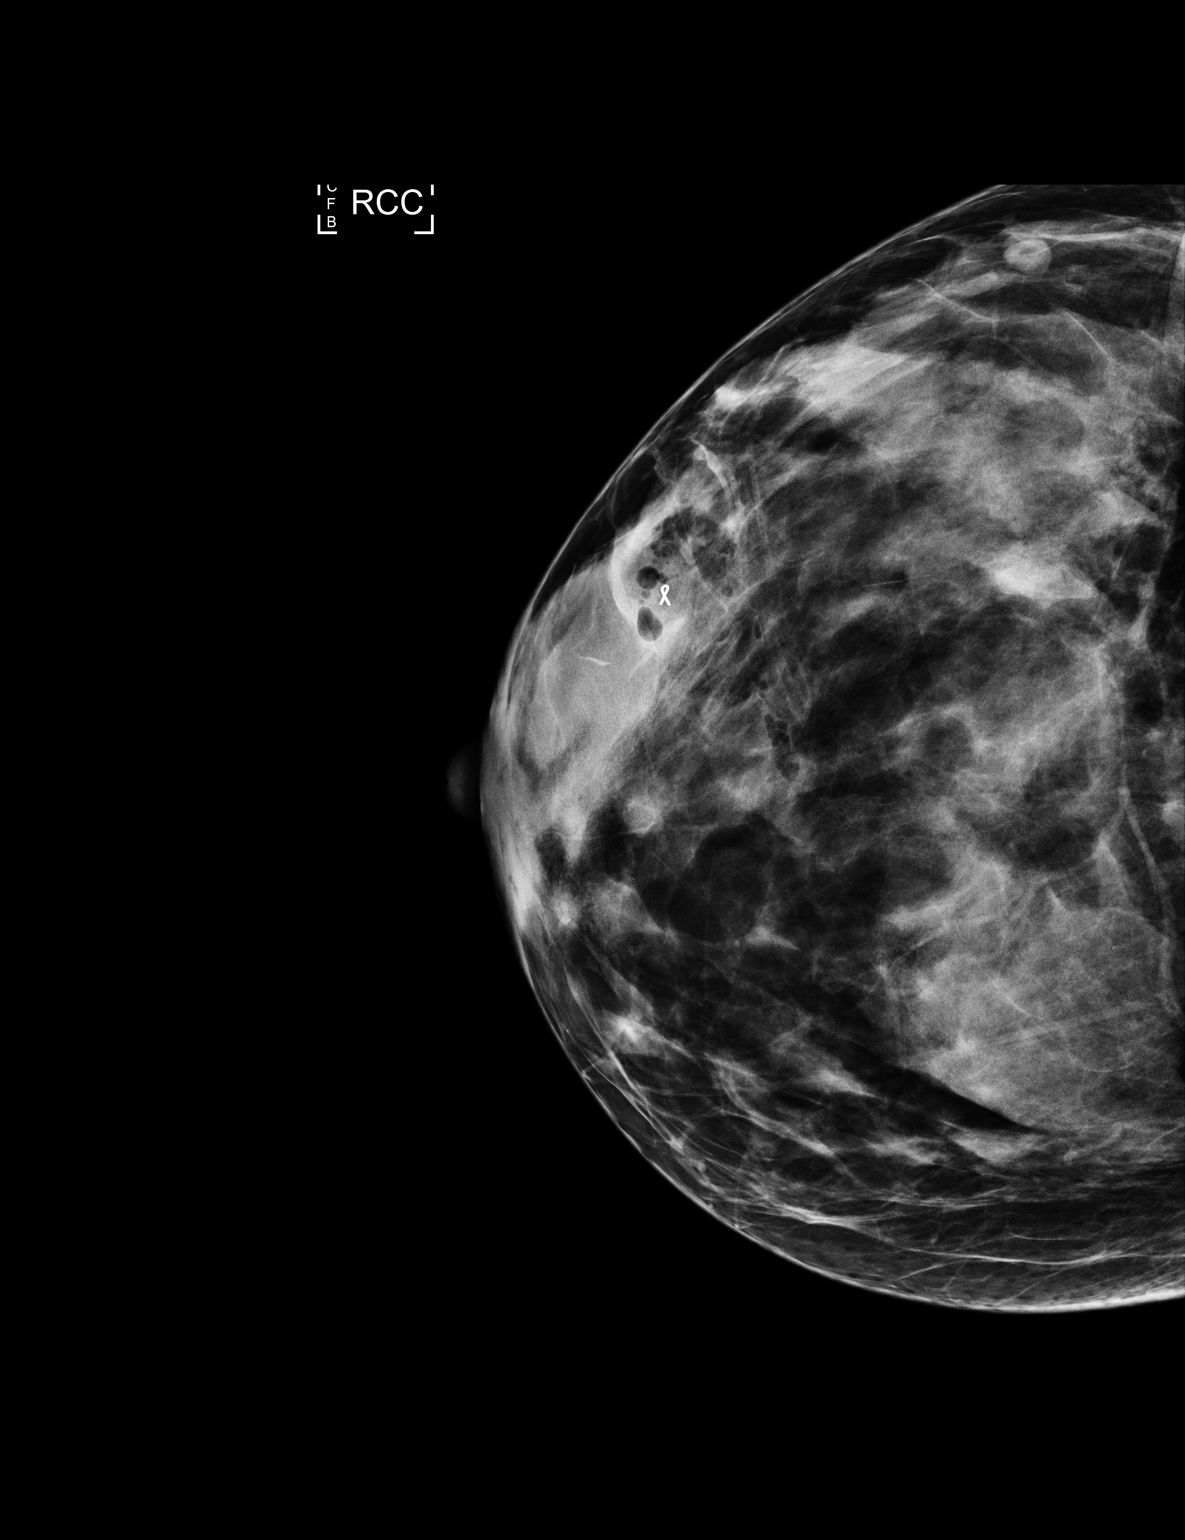

[2 of 2 positions shown; findings below may reference images not displayed]

FINDINGS: Mammographic images were obtained following ultrasound guided biopsy
of intraductal mass in the 8:30 o'clock location of the RIGHT breast
and placement of a ribbon shaped clip. The clip is identified in the
LATERAL anterior portion of the RIGHT breast, as expected.
IMPRESSION: Tissue marker clip in the expected location following biopsy.

Final Assessment: Post Procedure Mammograms for Marker Placement

## 2021-03-07 ENCOUNTER — Encounter: Payer: Self-pay | Admitting: Internal Medicine

## 2021-03-20 ENCOUNTER — Ambulatory Visit: Payer: Self-pay | Admitting: Family Medicine

## 2021-06-19 ENCOUNTER — Ambulatory Visit (INDEPENDENT_AMBULATORY_CARE_PROVIDER_SITE_OTHER): Payer: 59 | Admitting: Obstetrics & Gynecology

## 2021-06-19 ENCOUNTER — Other Ambulatory Visit (HOSPITAL_COMMUNITY)
Admission: RE | Admit: 2021-06-19 | Discharge: 2021-06-19 | Disposition: A | Discharge status: Home / Self Care | Payer: 59 | Source: Ambulatory Visit | Attending: Obstetrics & Gynecology | Admitting: Obstetrics & Gynecology

## 2021-06-19 ENCOUNTER — Other Ambulatory Visit: Payer: Self-pay

## 2021-06-19 ENCOUNTER — Telehealth: Payer: Self-pay | Admitting: *Deleted

## 2021-06-19 ENCOUNTER — Encounter: Payer: Self-pay | Admitting: Obstetrics & Gynecology

## 2021-06-19 VITALS — BP 118/78 | HR 92 | Resp 18 | Ht 64.17 in | Wt 128.2 lb

## 2021-06-19 DIAGNOSIS — Z01419 Encounter for gynecological examination (general) (routine) without abnormal findings: Secondary | ICD-10-CM | POA: Insufficient documentation

## 2021-06-19 DIAGNOSIS — D369 Benign neoplasm, unspecified site: Secondary | ICD-10-CM | POA: Diagnosis not present

## 2021-06-19 DIAGNOSIS — Z3009 Encounter for other general counseling and advice on contraception: Secondary | ICD-10-CM | POA: Diagnosis not present

## 2021-06-19 DIAGNOSIS — N920 Excessive and frequent menstruation with regular cycle: Secondary | ICD-10-CM

## 2021-06-19 LAB — CBC
HCT: 27 % — ABNORMAL LOW (ref 35.0–45.0)
Hemoglobin: 8.4 g/dL — ABNORMAL LOW (ref 11.7–15.5)
MCH: 25 pg — ABNORMAL LOW (ref 27.0–33.0)
MCHC: 31.1 g/dL — ABNORMAL LOW (ref 32.0–36.0)
MCV: 80.4 fL (ref 80.0–100.0)
MPV: 10 fL (ref 7.5–12.5)
Platelets: 417 10*3/uL — ABNORMAL HIGH (ref 140–400)
RBC: 3.36 10*6/uL — ABNORMAL LOW (ref 3.80–5.10)
RDW: 15.1 % — ABNORMAL HIGH (ref 11.0–15.0)
WBC: 8.1 10*3/uL (ref 3.8–10.8)

## 2021-06-19 NOTE — Progress Notes (Signed)
Miranda [name] Miranda [name] August 28, 1986 [id number]   History:    35 y.o. G0 Single.  Works for [employer].   RP:  Established patient presenting for annual gyn exam    HPI: Menses regular every month with long standing heavy flow with secondary anemia.  Last Hb 11/2018 at 9.5.  Patient is taking iron sulfate supplements twice a day.  No pelvic pain.  Normal vaginal secretions.  Abstinent. Urine and bowel movements normal.  Breast currently normal. Rt galactorrhea in 2020.  Sent for a bilateral diagnostic mammogram.  Right breast mammogram was suspicious, a right breast biopsy was done on 10/08/2018, showing a ductal papilloma with usual ductal hyperplasia, seen by general surgery Dr. Erroll Luna, patient chose to observe rather than excise.  No longer having galactorrhea.  No breast pain or breast lump felt.  Body mass index 21.89.  Not very physically active.  Health labs with family physician.  Past medical history,surgical history, family history and social history were all reviewed and documented in the EPIC chart.  Gynecologic History Patient's last menstrual period was 06/07/2021 (exact date).  Obstetric History OB History  Gravida Para Term Preterm AB Living  0            SAB IAB Ectopic Multiple Live Births                ROS: A ROS was performed and pertinent positives and negatives are included in the history.  GENERAL: No fevers or chills. HEENT: No change in vision, no earache, sore throat or sinus congestion. NECK: No pain or stiffness. CARDIOVASCULAR: No chest pain or pressure. No palpitations. PULMONARY: No shortness of breath, cough or wheeze. GASTROINTESTINAL: No abdominal pain, nausea, vomiting or diarrhea, melena or bright red blood per rectum. GENITOURINARY: No urinary frequency, urgency, hesitancy or dysuria. MUSCULOSKELETAL: No joint or muscle pain, no back pain, no recent trauma. DERMATOLOGIC: No rash, no itching, no lesions. ENDOCRINE: No polyuria, polydipsia, no  heat or cold intolerance. No recent change in weight. HEMATOLOGICAL: No anemia or easy bruising or bleeding. NEUROLOGIC: No headache, seizures, numbness, tingling or weakness. PSYCHIATRIC: No depression, no loss of interest in normal activity or change in sleep pattern.     Exam:   BP 118/78 (BP Location: Right Arm)   Pulse 92   Resp 18   Ht 5' 4.17" (1.63 m)   Wt 128 lb 3.2 oz (58.2 kg)   LMP 06/07/2021 (Exact Date)   BMI 21.89 kg/m   Body mass index is 21.89 kg/m.  General appearance : Well developed well nourished female. No acute distress HEENT: Eyes: no retinal hemorrhage or exudates,  Neck supple, trachea midline, no carotid bruits, no thyroidmegaly Lungs: Clear to auscultation, no rhonchi or wheezes, or rib retractions  Heart: Regular rate and rhythm, no murmurs or gallops Breast:Examined in sitting and supine position were symmetrical in appearance, no palpable masses or tenderness,  no skin retraction, no nipple inversion, no nipple discharge, no skin discoloration, no axillary or supraclavicular lymphadenopathy Abdomen: no palpable masses or tenderness, no rebound or guarding Extremities: no edema or skin discoloration or tenderness  Pelvic: Vulva: Normal             Vagina: No gross lesions or discharge  Cervix: No gross lesions or discharge.  Pap reflex done.  Uterus  AV, normal size, shape and consistency, non-tender and mobile  Adnexa  Without masses or tenderness  Anus: Normal   Assessment/Plan:  35 y.o. female for annual exam  1. Encounter for routine gynecological examination with Papanicolaou smear of cervix Normal gynecologic exam.  Pap reflex done.  Breast exam normal.  History of right breast ductal papilloma.  Health labs with family physician.  Body mass index 21.89.  Recommend to increase fitness activities. - Cytology - PAP( [facility])  2. Encounter for other general counseling or advice on contraception Will use condoms as needed.  3.  Menorrhagia with regular cycle Longstanding heavy periods with secondary anemia.  Patient declines any contraceptive or hormonal control of her cycle.  Will recheck a CBC today.  We will continue on iron supplements. - CBC  4. Ductal papilloma Right breast ductal papilloma on biopsy in 2020.  Breast exam normal.  We will repeat a right diagnostic mammogram and a left screening mammogram now.  Other orders - ferrous sulfate 325 (65 FE) MG tablet; Take 325 mg by mouth in the morning and at bedtime. - Multiple Vitamin (MULTI-VITAMIN DAILY PO); Take 1 tablet by mouth daily.   Princess Bruins MD, 1:56 PM 06/19/2021

## 2021-06-19 NOTE — Telephone Encounter (Signed)
Patient scheduled on 06/29/21 @ 7:20 am at the [facility] center of [place]. Patient informed.

## 2021-06-19 NOTE — Telephone Encounter (Signed)
-----   Message from Princess Bruins, MD sent at 06/19/2021  2:08 PM EDT ----- Regarding: Schedule Left Screening mammo and Right Diagnostic mammo MMG: 10/06/18: Bilateral Dx MMG for right nipple d/c at [facility]. Right breast Bx on 10/08/18, DUCTAL PAPILLOMA WITH USUAL DUCTAL HYPERPLASIA of the Right breast. Breast exam normal 06/19/2021.

## 2021-06-20 LAB — CYTOLOGY - PAP: Diagnosis: NEGATIVE

## 2021-06-29 ENCOUNTER — Ambulatory Visit
Admission: RE | Admit: 2021-06-29 | Discharge: 2021-06-29 | Disposition: A | Discharge status: Home / Self Care | Payer: 59 | Source: Ambulatory Visit | Attending: Obstetrics & Gynecology | Admitting: Obstetrics & Gynecology

## 2021-06-29 ENCOUNTER — Other Ambulatory Visit: Payer: Self-pay

## 2021-06-29 DIAGNOSIS — R922 Inconclusive mammogram: Secondary | ICD-10-CM | POA: Diagnosis not present

## 2021-06-29 DIAGNOSIS — D369 Benign neoplasm, unspecified site: Secondary | ICD-10-CM

## 2021-06-29 DIAGNOSIS — N6452 Nipple discharge: Secondary | ICD-10-CM | POA: Diagnosis not present

## 2023-04-22 ENCOUNTER — Encounter: Payer: Self-pay | Admitting: Internal Medicine

## 2023-04-22 ENCOUNTER — Ambulatory Visit (INDEPENDENT_AMBULATORY_CARE_PROVIDER_SITE_OTHER): Payer: 59 | Admitting: Internal Medicine

## 2023-04-22 VITALS — BP 118/70 | HR 88 | Temp 98.9°F | Resp 16 | Ht 64.5 in | Wt 130.6 lb

## 2023-04-22 DIAGNOSIS — M839 Adult osteomalacia, unspecified: Secondary | ICD-10-CM

## 2023-04-22 DIAGNOSIS — N6011 Diffuse cystic mastopathy of right breast: Secondary | ICD-10-CM | POA: Diagnosis not present

## 2023-04-22 DIAGNOSIS — Z114 Encounter for screening for human immunodeficiency virus [HIV]: Secondary | ICD-10-CM | POA: Insufficient documentation

## 2023-04-22 DIAGNOSIS — M069 Rheumatoid arthritis, unspecified: Secondary | ICD-10-CM

## 2023-04-22 DIAGNOSIS — Z1159 Encounter for screening for other viral diseases: Secondary | ICD-10-CM | POA: Diagnosis not present

## 2023-04-22 DIAGNOSIS — Z0000 Encounter for general adult medical examination without abnormal findings: Secondary | ICD-10-CM

## 2023-04-22 DIAGNOSIS — Z0001 Encounter for general adult medical examination with abnormal findings: Secondary | ICD-10-CM | POA: Diagnosis not present

## 2023-04-22 DIAGNOSIS — D500 Iron deficiency anemia secondary to blood loss (chronic): Secondary | ICD-10-CM

## 2023-04-22 DIAGNOSIS — D539 Nutritional anemia, unspecified: Secondary | ICD-10-CM | POA: Diagnosis not present

## 2023-04-22 DIAGNOSIS — R1313 Dysphagia, pharyngeal phase: Secondary | ICD-10-CM

## 2023-04-22 LAB — CBC WITH DIFFERENTIAL/PLATELET
Basophils Absolute: 0.1 10*3/uL (ref 0.0–0.1)
Basophils Relative: 0.7 % (ref 0.0–3.0)
Eosinophils Absolute: 0.1 10*3/uL (ref 0.0–0.7)
Eosinophils Relative: 1.2 % (ref 0.0–5.0)
HCT: 30.9 % — ABNORMAL LOW (ref 36.0–46.0)
Hemoglobin: 9.8 g/dL — ABNORMAL LOW (ref 12.0–15.0)
Lymphocytes Relative: 33.4 % (ref 12.0–46.0)
Lymphs Abs: 2.4 10*3/uL (ref 0.7–4.0)
MCHC: 31.7 g/dL (ref 30.0–36.0)
MCV: 81.2 fl (ref 78.0–100.0)
Monocytes Absolute: 0.4 10*3/uL (ref 0.1–1.0)
Monocytes Relative: 6.1 % (ref 3.0–12.0)
Neutro Abs: 4.3 10*3/uL (ref 1.4–7.7)
Neutrophils Relative %: 58.6 % (ref 43.0–77.0)
Platelets: 395 10*3/uL (ref 150.0–400.0)
RBC: 3.81 Mil/uL — ABNORMAL LOW (ref 3.87–5.11)
RDW: 16 % — ABNORMAL HIGH (ref 11.5–15.5)
WBC: 7.3 10*3/uL (ref 4.0–10.5)

## 2023-04-22 LAB — IBC + FERRITIN
Ferritin: 113.7 ng/mL (ref 10.0–291.0)
Iron: 47 ug/dL (ref 42–145)
Saturation Ratios: 12.7 % — ABNORMAL LOW (ref 20.0–50.0)
TIBC: 369.6 ug/dL (ref 250.0–450.0)
Transferrin: 264 mg/dL (ref 212.0–360.0)

## 2023-04-22 LAB — VITAMIN D 25 HYDROXY (VIT D DEFICIENCY, FRACTURES): VITD: 48.46 ng/mL (ref 30.00–100.00)

## 2023-04-22 NOTE — Patient Instructions (Signed)

## 2023-04-22 NOTE — Progress Notes (Unsigned)
Subjective:  Patient ID: Miranda [name], female    DOB: [birth year]  Age: 37 y.o. MRN: [id number]  CC: Annual Exam and Anemia   HPI MAYVEN STUPKA presents for a CPX and f/up ---  Discussed the use of AI scribe software for clinical note transcription with the patient, who gave verbal consent to proceed.  History of Present Illness   The patient, with a history of anemia, presents for a routine physical. She reports taking two iron supplements daily for the past twenty years, but do not believe they are effective. She deny any symptoms of anemia, such as fatigue or weakness. The patient also has a history of rheumatoid arthritis, but has chosen not to pursue treatment as previous medications were ineffective.  The patient has a history of menstrual cycles with heavy bleeding, but these have reduced to two days in duration. She underwent surgery to remove a polyp and two cysts, but continue to have cycles. She deny any blood in their stool and have no family history of colon cancer.  In addition, the patient reports a history of nipple discharge, which led to an early mammogram and subsequent biopsy. The discharge has decreased since the biopsy, and she are currently under surveillance for this issue.  The patient also reports difficulty swallowing for several years, with a sensation of food getting stuck and occasional choking. She have a history of repetitive motion at work, which has led to decreased function in their thumb. She deny any joint swelling but report ongoing joint pain.  Lastly, the patient has a history of night sweats. She have declined a tetanus shot but have requested a pneumonia vaccine. She are also taking a multivitamin in addition to their iron supplement.       Outpatient Medications Prior to Visit  Medication Sig Dispense Refill   Calcium Carbonate (CALCIUM 600 PO) Take by mouth.     ferrous sulfate 325 (65 FE) MG tablet Take 325 mg by mouth in the morning and  at bedtime.     ibuprofen (ADVIL,MOTRIN) 200 MG tablet Take 400-800 mg by mouth every 6 (six) hours as needed.     Multiple Vitamin (MULTI-VITAMIN DAILY PO) Take 1 tablet by mouth daily.     No facility-administered medications prior to visit.    ROS Review of Systems  Constitutional:  Negative for appetite change, chills, diaphoresis, fatigue and fever.  HENT:  Positive for trouble swallowing.   Eyes: Negative.   Respiratory:  Negative for cough, chest tightness, shortness of breath and wheezing.   Cardiovascular:  Negative for chest pain, palpitations and leg swelling.  Gastrointestinal: Negative.  Negative for abdominal pain, blood in stool, constipation, diarrhea and vomiting.  Genitourinary: Negative.   Musculoskeletal:  Positive for arthralgias. Negative for joint swelling and myalgias.  Skin: Negative.   Neurological: Negative.  Negative for dizziness and weakness.  Hematological:  Negative for adenopathy. Does not bruise/bleed easily.  Psychiatric/Behavioral:  Positive for dysphoric mood. Negative for sleep disturbance and suicidal ideas. The patient is not nervous/anxious.     Objective:  BP 118/70 (BP Location: Left Arm, Patient Position: Sitting, Cuff Size: Normal)   Pulse 88   Temp 98.9 F (37.2 C) (Oral)   Resp 16   Ht 5' 4.5" (1.638 m)   Wt 130 lb 9.6 oz (59.2 kg)   LMP 04/08/2023   SpO2 100%   BMI 22.07 kg/m   BP Readings from Last 3 Encounters:  04/22/23 118/70  06/19/21 118/78  03/10/19 120/88    Wt Readings from Last 3 Encounters:  04/22/23 130 lb 9.6 oz (59.2 kg)  06/19/21 128 lb 3.2 oz (58.2 kg)  03/10/19 129 lb (58.5 kg)    Physical Exam Vitals reviewed.  Constitutional:      General: She is not in acute distress.    Appearance: She is ill-appearing. She is not toxic-appearing or diaphoretic.  HENT:     Nose: Nose normal.     Mouth/Throat:     Mouth: Mucous membranes are moist. Mucous membranes are pale.  Eyes:     General: No scleral  icterus.    Conjunctiva/sclera: Conjunctivae normal.  Cardiovascular:     Rate and Rhythm: Normal rate and regular rhythm.     Heart sounds: No murmur heard.    No gallop.  Pulmonary:     Effort: Pulmonary effort is normal.     Breath sounds: No stridor. No wheezing, rhonchi or rales.  Abdominal:     General: Abdomen is flat.     Palpations: There is no mass.     Tenderness: There is no abdominal tenderness. There is no guarding.     Hernia: No hernia is present.  Musculoskeletal:     Cervical back: Neck supple.  Lymphadenopathy:     Cervical: No cervical adenopathy.  Skin:    General: Skin is warm.     Findings: No lesion or rash.  Neurological:     General: No focal deficit present.     Mental Status: She is alert. Mental status is at baseline.  Psychiatric:        Attention and Perception: She is inattentive.        Mood and Affect: Mood is depressed. Mood is not anxious. Affect is blunt, flat and angry. Affect is not labile or tearful.        Speech: Speech normal.        Behavior: Behavior normal. Behavior is cooperative.        Thought Content: Thought content normal. Thought content is not paranoid or delusional. Thought content does not include homicidal or suicidal ideation.        Cognition and Memory: Cognition and memory normal.        Judgment: Judgment normal.     Lab Results  Component Value Date   WBC 7.3 04/22/2023   HGB 9.8 (L) 04/22/2023   HCT 30.9 (L) 04/22/2023   PLT 395.0 04/22/2023   GLUCOSE 93 07/04/2016   CHOL 161 03/18/2018   TRIG 65.0 03/18/2018   HDL 41.60 03/18/2018   LDLCALC 106 (H) 03/18/2018   ALT 7 07/04/2016   AST 14 07/04/2016   NA 136 07/04/2016   K 3.5 07/04/2016   CL 102 07/04/2016   CREATININE 0.50 07/04/2016   BUN 16 07/04/2016   CO2 25 07/04/2016   TSH 0.70 07/04/2016   HGBA1C 5.8 05/16/2015    MM DIAG BREAST TOMO BILATERAL  Result Date: 06/29/2021 CLINICAL DATA:  37 year old female presenting for follow-up for a  biopsy proven intraductal papilloma. The biopsy was performed in January of 2020. Prior to the biopsy she was having right nipple discharge, however this has resolved following biopsy. She had a surgical consultation with Dr. Luisa Hart. She was given the option of surgery or follow-up, and she declined surgery. She is currently asymptomatic. EXAM: DIGITAL DIAGNOSTIC BILATERAL MAMMOGRAM WITH TOMOSYNTHESIS AND CAD; ULTRASOUND RIGHT BREAST LIMITED TECHNIQUE: Bilateral digital diagnostic mammography and breast tomosynthesis was performed. The images were evaluated with computer-aided  detection.; Targeted ultrasound examination of the right breast was performed COMPARISON:  Previous exam(s). ACR Breast Density Category c: The breast tissue is heterogeneously dense, which may obscure small masses. FINDINGS: No suspicious calcifications, masses or areas of distortion are seen in the bilateral breasts. The ribbon shaped biopsy marking clip from the prior benign biopsy of a papilloma is seen in the lateral anterior right breast. No suspicious changes at the biopsy site. Ultrasound targeted to the right breast at 9 o'clock in the retroareolar location demonstrates a stable appearance of the expanded duct with the intraductal papilloma. Papilloma measures approximately 1.4 x 0.2 x 1.5 cm, previously measuring 2.1 x 0.4 x 0.5 cm. IMPRESSION: 1. No suspicious changes at the site of the biopsy of the benign intraductal papilloma in the right breast at 9 o'clock. 2.  No mammographic evidence of malignancy in the bilateral breasts. RECOMMENDATION: Screening mammogram at age 34 unless there are persistent or intervening clinical concerns. (Code:SM-B-40A) I have discussed the findings and recommendations with the patient. If applicable, a reminder letter will be sent to the patient regarding the next appointment. BI-RADS CATEGORY  2: Benign. Electronically Signed   By: Frederico Hamman M.D.   On: 06/29/2021 08:45  US BREAST LTD UNI  RIGHT INC AXILLA  Result Date: 06/29/2021 CLINICAL DATA:  37 year old female presenting for follow-up for a biopsy proven intraductal papilloma. The biopsy was performed in January of 2020. Prior to the biopsy she was having right nipple discharge, however this has resolved following biopsy. She had a surgical consultation with Dr. Luisa Hart. She was given the option of surgery or follow-up, and she declined surgery. She is currently asymptomatic. EXAM: DIGITAL DIAGNOSTIC BILATERAL MAMMOGRAM WITH TOMOSYNTHESIS AND CAD; ULTRASOUND RIGHT BREAST LIMITED TECHNIQUE: Bilateral digital diagnostic mammography and breast tomosynthesis was performed. The images were evaluated with computer-aided detection.; Targeted ultrasound examination of the right breast was performed COMPARISON:  Previous exam(s). ACR Breast Density Category c: The breast tissue is heterogeneously dense, which may obscure small masses. FINDINGS: No suspicious calcifications, masses or areas of distortion are seen in the bilateral breasts. The ribbon shaped biopsy marking clip from the prior benign biopsy of a papilloma is seen in the lateral anterior right breast. No suspicious changes at the biopsy site. Ultrasound targeted to the right breast at 9 o'clock in the retroareolar location demonstrates a stable appearance of the expanded duct with the intraductal papilloma. Papilloma measures approximately 1.4 x 0.2 x 1.5 cm, previously measuring 2.1 x 0.4 x 0.5 cm. IMPRESSION: 1. No suspicious changes at the site of the biopsy of the benign intraductal papilloma in the right breast at 9 o'clock. 2.  No mammographic evidence of malignancy in the bilateral breasts. RECOMMENDATION: Screening mammogram at age 37 unless there are persistent or intervening clinical concerns. (Code:SM-B-40A) I have discussed the findings and recommendations with the patient. If applicable, a reminder letter will be sent to the patient regarding the next appointment. BI-RADS  CATEGORY  2: Benign. Electronically Signed   By: Frederico Hamman M.D.   On: 06/29/2021 08:45   Assessment & Plan:  Iron deficiency anemia due to chronic blood loss -     CBC with Differential/Platelet; Future -     IBC + Ferritin; Future  Rheumatoid arthritis involving left wrist, unspecified whether rheumatoid factor present [facility]) -     Ambulatory referral to Rheumatology  Routine general medical examination at a health care facility -     Hepatitis C antibody; Future -  HIV Antibody (routine testing w rflx); Future  Vitamin D deficient osteomalacia -     VITAMIN D 25 Hydroxy (Vit-D Deficiency, Fractures); Future  Need for hepatitis C screening test -     Hepatitis C antibody; Future  Encounter for screening for HIV -     HIV Antibody (routine testing w rflx); Future  Pharyngeal dysphagia -     Ambulatory referral to Gastroenterology  Ductal papillomatosis of breast, right -     MM 3D DIAGNOSTIC MAMMOGRAM BILATERAL BREAST; Future     Follow-up: Return in about 6 months (around 10/23/2023).  Sanda Linger, MD

## 2023-04-23 LAB — HEPATITIS C ANTIBODY: Hepatitis C Ab: NONREACTIVE

## 2024-10-12 ENCOUNTER — Other Ambulatory Visit: Payer: Self-pay | Admitting: Internal Medicine
